# Patient Record
Sex: Male | Born: 1935
Health system: Southern US, Community
[De-identification: ages and names within clinical notes are randomized; demographics above are authoritative.]

## PROBLEM LIST (undated history)

## (undated) DIAGNOSIS — Z972 Presence of dental prosthetic device (complete) (partial): Secondary | ICD-10-CM

## (undated) DIAGNOSIS — I739 Peripheral vascular disease, unspecified: Secondary | ICD-10-CM

## (undated) DIAGNOSIS — Z973 Presence of spectacles and contact lenses: Secondary | ICD-10-CM

## (undated) DIAGNOSIS — J189 Pneumonia, unspecified organism: Secondary | ICD-10-CM

## (undated) DIAGNOSIS — I2510 Atherosclerotic heart disease of native coronary artery without angina pectoris: Secondary | ICD-10-CM

## (undated) DIAGNOSIS — I6529 Occlusion and stenosis of unspecified carotid artery: Secondary | ICD-10-CM

## (undated) DIAGNOSIS — B029 Zoster without complications: Secondary | ICD-10-CM

## (undated) DIAGNOSIS — E785 Hyperlipidemia, unspecified: Secondary | ICD-10-CM

## (undated) DIAGNOSIS — I10 Essential (primary) hypertension: Secondary | ICD-10-CM

## (undated) HISTORY — PX: MULTIPLE TOOTH EXTRACTIONS: SHX2053

## (undated) HISTORY — DX: Essential (primary) hypertension: I10

## (undated) HISTORY — PX: COLONOSCOPY: SHX174

## (undated) HISTORY — DX: Hyperlipidemia, unspecified: E78.5

## (undated) HISTORY — PX: CORONARY ANGIOPLASTY WITH STENT PLACEMENT: SHX49

## (undated) HISTORY — DX: Atherosclerotic heart disease of native coronary artery without angina pectoris: I25.10

## (undated) HISTORY — PX: CARDIAC CATHETERIZATION: SHX172

## (undated) HISTORY — PX: CATARACT EXTRACTION W/ INTRAOCULAR LENS  IMPLANT, BILATERAL: SHX1307

## (undated) HISTORY — PX: TONSILLECTOMY: SUR1361

## (undated) HISTORY — DX: Occlusion and stenosis of unspecified carotid artery: I65.29

## (undated) HISTORY — DX: Peripheral vascular disease, unspecified: I73.9

## (undated) HISTORY — PX: EYE SURGERY: SHX253

---
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
## 1998-09-24 ENCOUNTER — Encounter: Payer: Self-pay | Admitting: Emergency Medicine

## 1998-09-24 ENCOUNTER — Inpatient Hospital Stay (HOSPITAL_COMMUNITY): Admission: EM | Admit: 1998-09-24 | Discharge: 1998-09-26 | Payer: Self-pay | Admitting: Emergency Medicine

## 1998-09-24 HISTORY — PX: CORONARY ANGIOPLASTY WITH STENT PLACEMENT: SHX49

## 2004-05-10 ENCOUNTER — Emergency Department (HOSPITAL_COMMUNITY): Admission: EM | Admit: 2004-05-10 | Discharge: 2004-05-10 | Payer: Self-pay | Admitting: Emergency Medicine

## 2005-05-29 ENCOUNTER — Inpatient Hospital Stay (HOSPITAL_BASED_OUTPATIENT_CLINIC_OR_DEPARTMENT_OTHER): Admission: RE | Admit: 2005-05-29 | Discharge: 2005-05-29 | Payer: Self-pay | Admitting: Cardiovascular Disease

## 2005-05-31 ENCOUNTER — Inpatient Hospital Stay (HOSPITAL_COMMUNITY): Admission: RE | Admit: 2005-05-31 | Discharge: 2005-06-01 | Payer: Self-pay | Admitting: Cardiovascular Disease

## 2008-11-20 DIAGNOSIS — E785 Hyperlipidemia, unspecified: Secondary | ICD-10-CM | POA: Insufficient documentation

## 2008-11-20 DIAGNOSIS — I10 Essential (primary) hypertension: Secondary | ICD-10-CM | POA: Insufficient documentation

## 2008-11-20 DIAGNOSIS — M1990 Unspecified osteoarthritis, unspecified site: Secondary | ICD-10-CM | POA: Insufficient documentation

## 2008-11-20 DIAGNOSIS — I2510 Atherosclerotic heart disease of native coronary artery without angina pectoris: Secondary | ICD-10-CM | POA: Insufficient documentation

## 2008-11-23 ENCOUNTER — Ambulatory Visit: Payer: Self-pay | Admitting: Internal Medicine

## 2008-12-31 ENCOUNTER — Ambulatory Visit: Payer: Self-pay | Admitting: Internal Medicine

## 2009-08-20 ENCOUNTER — Inpatient Hospital Stay (HOSPITAL_COMMUNITY): Admission: EM | Admit: 2009-08-20 | Discharge: 2009-08-25 | Payer: Self-pay | Admitting: Emergency Medicine

## 2010-03-22 ENCOUNTER — Ambulatory Visit: Payer: Self-pay | Admitting: Cardiovascular Disease

## 2010-06-02 IMAGING — CR DG CHEST 1V PORT
1 series · 1 of 1 positions shown · non-contrast
Comparison: None

CLINICAL DATA: Chest pain

PORTABLE CHEST - 1 VIEW

[AP]
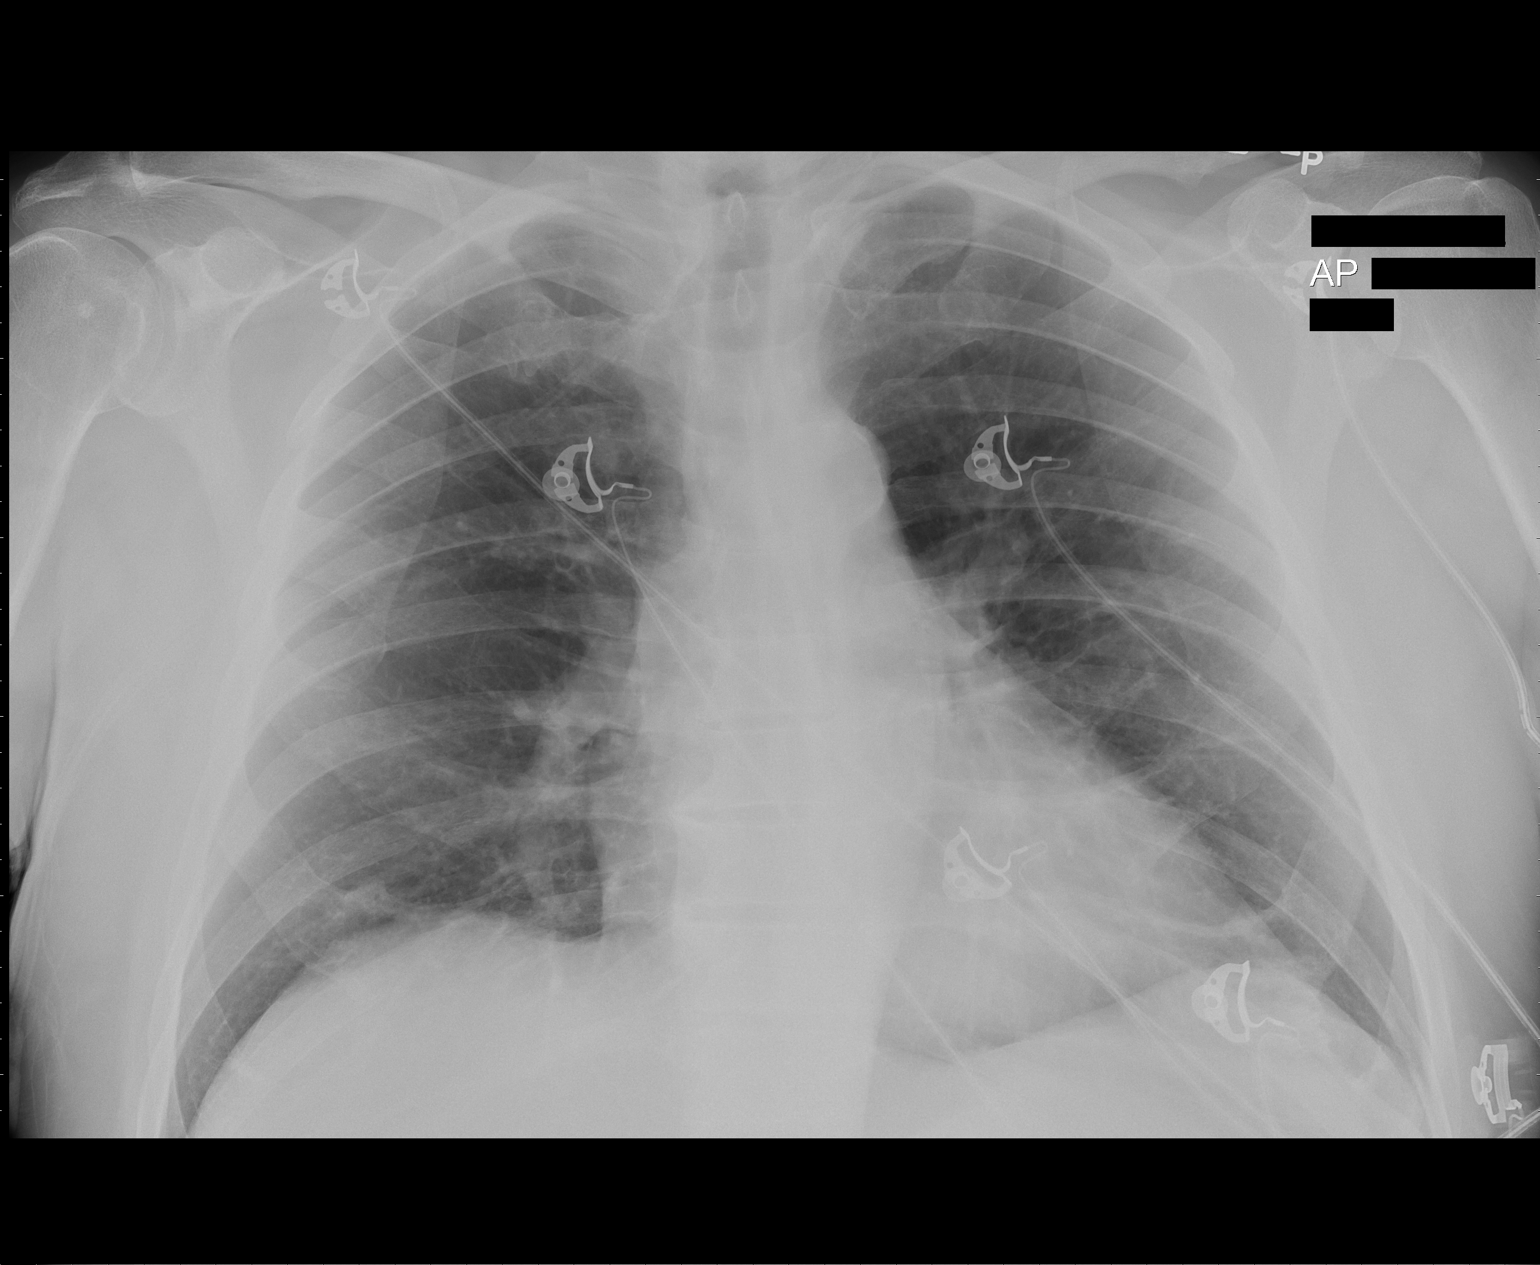

[1 of 1 positions shown; findings below may reference images not displayed]

FINDINGS: Normal heart size and vascularity.  Negative for
pneumonia, edema, effusion or pneumothorax.  Midline trachea.
Atherosclerosis of the aorta.
IMPRESSION: No acute chest process.

## 2010-09-27 ENCOUNTER — Ambulatory Visit: Payer: Self-pay | Admitting: Cardiovascular Disease

## 2010-09-29 ENCOUNTER — Ambulatory Visit (INDEPENDENT_AMBULATORY_CARE_PROVIDER_SITE_OTHER): Payer: Medicare Other | Admitting: Cardiovascular Disease

## 2010-09-29 DIAGNOSIS — Z9861 Coronary angioplasty status: Secondary | ICD-10-CM

## 2010-09-29 DIAGNOSIS — I2510 Atherosclerotic heart disease of native coronary artery without angina pectoris: Secondary | ICD-10-CM

## 2010-10-16 LAB — BASIC METABOLIC PANEL
BUN: 10 mg/dL (ref 6–23)
BUN: 10 mg/dL (ref 6–23)
CO2: 25 mEq/L (ref 19–32)
CO2: 29 mEq/L (ref 19–32)
CO2: 29 mEq/L (ref 19–32)
Calcium: 8.5 mg/dL (ref 8.4–10.5)
Chloride: 104 mEq/L (ref 96–112)
Chloride: 104 mEq/L (ref 96–112)
Creatinine, Ser: 0.79 mg/dL (ref 0.4–1.5)
Creatinine, Ser: 0.87 mg/dL (ref 0.4–1.5)
Creatinine, Ser: 0.9 mg/dL (ref 0.4–1.5)
GFR calc Af Amer: >60 mL/min (ref >60)
GFR calc Af Amer: >60 mL/min (ref >60)
Potassium: 3.5 mEq/L (ref 3.5–5.1)
Potassium: 3.8 mEq/L (ref 3.5–5.1)
Sodium: 141 mEq/L (ref 135–145)

## 2010-10-16 LAB — POCT I-STAT, CHEM 8
BUN: 13 mg/dL (ref 6–23)
Calcium, Ion: 1.16 mmol/L (ref 1.12–1.32)
Chloride: 105 mEq/L (ref 96–112)
Creatinine, Ser: 0.8 mg/dL (ref 0.4–1.5)
Glucose, Bld: 139 mg/dL — ABNORMAL HIGH (ref 70–99)
HCT: 41 % (ref 39.0–52.0)
Hemoglobin: 13.9 g/dL (ref 13.0–17.0)
Potassium: 3.7 mEq/L (ref 3.5–5.1)
Sodium: 139 mEq/L (ref 135–145)
TCO2: 26 mmol/L (ref 0–100)

## 2010-10-16 LAB — CBC
HCT: 38.3 % — ABNORMAL LOW (ref 39.0–52.0)
HCT: 39.4 % (ref 39.0–52.0)
HCT: 39.5 % (ref 39.0–52.0)
HCT: 39.8 % (ref 39.0–52.0)
HCT: 40.2 % (ref 39.0–52.0)
Hemoglobin: 13.7 g/dL (ref 13.0–17.0)
Hemoglobin: 14.2 g/dL (ref 13.0–17.0)
MCHC: 34.7 g/dL (ref 30.0–36.0)
MCHC: 35 g/dL (ref 30.0–36.0)
MCHC: 35.3 g/dL (ref 30.0–36.0)
MCHC: 35.5 g/dL (ref 30.0–36.0)
MCV: 103.9 fL — ABNORMAL HIGH (ref 78.0–100.0)
MCV: 105.3 fL — ABNORMAL HIGH (ref 78.0–100.0)
MCV: 105.3 fL — ABNORMAL HIGH (ref 78.0–100.0)
MCV: 105.6 fL — ABNORMAL HIGH (ref 78.0–100.0)
MCV: 105.9 fL — ABNORMAL HIGH (ref 78.0–100.0)
Platelets: 282 10*3/uL (ref 150–400)
Platelets: 285 10*3/uL (ref 150–400)
Platelets: 287 10*3/uL (ref 150–400)
Platelets: 291 10*3/uL (ref 150–400)
Platelets: 300 10*3/uL (ref 150–400)
RBC: 3.62 MIL/uL — ABNORMAL LOW (ref 4.22–5.81)
RBC: 3.76 MIL/uL — ABNORMAL LOW (ref 4.22–5.81)
RBC: 3.87 MIL/uL — ABNORMAL LOW (ref 4.22–5.81)
RDW: 15 % (ref 11.5–15.5)
RDW: 15 % (ref 11.5–15.5)
RDW: 15.1 % (ref 11.5–15.5)
WBC: 8.2 10*3/uL (ref 4.0–10.5)
WBC: 8.9 10*3/uL (ref 4.0–10.5)
WBC: 9 10*3/uL (ref 4.0–10.5)

## 2010-10-16 LAB — CARDIAC PANEL(CRET KIN+CKTOT+MB+TROPI)
Relative Index: 3.1 — ABNORMAL HIGH (ref 0.0–2.5)
Relative Index: 4.1 — ABNORMAL HIGH (ref 0.0–2.5)
Total CK: 100 U/L (ref 7–232)
Troponin I: 0.26 ng/mL — ABNORMAL HIGH (ref 0.00–0.06)
Troponin I: 0.32 ng/mL — ABNORMAL HIGH (ref 0.00–0.06)

## 2010-10-16 LAB — DIFFERENTIAL
Basophils Absolute: 0 10*3/uL (ref 0.0–0.1)
Basophils Relative: 0 % (ref 0–1)
Eosinophils Absolute: 0.4 10*3/uL (ref 0.0–0.7)
Eosinophils Relative: 4 % (ref 0–5)
Lymphocytes Relative: 16 % (ref 12–46)
Lymphs Abs: 1.3 10*3/uL (ref 0.7–4.0)
Monocytes Absolute: 0.9 10*3/uL (ref 0.1–1.0)
Monocytes Relative: 11 % (ref 3–12)
Neutro Abs: 5.6 10*3/uL (ref 1.7–7.7)
Neutrophils Relative %: 69 % (ref 43–77)

## 2010-10-16 LAB — POCT CARDIAC MARKERS

## 2010-12-16 NOTE — Cardiovascular Report (Signed)
NAME:  Brandon Roy, Brandon Roy                  ACCOUNT NO.:  [id number]   MEDICAL RECORD NO.:  [id number]          PATIENT TYPE:  OIB   LOCATION:  1962                         FACILITY:  [facility]   PHYSICIAN:  Vesta Mixer, M.D. DATE OF BIRTH:  [birth year]   DATE OF PROCEDURE:  05/29/2005  DATE OF DISCHARGE:                              CARDIAC CATHETERIZATION   [name] is a 77 year old gentleman with a history of coronary artery  disease. He is status post PTCA and stenting of his left anterior descending  artery approximately 6 years ago. He presented to the office with some  recent episodes of chest pain. The chest pain would occurred intermittently  with exertion. He is scheduled for an outpatient heart catheterization.   PROCEDURE:  Left heart catheterization with coronary angiography.   The right femoral artery was easily cannulated using a modified Seldinger  technique.   HEMODYNAMICS:  The LV pressure was 118/20.  The Aortic pressure is 116/62.   ANGIOGRAPHY:  1.  Left main:  The left main is fairly smooth and normal.  2.  The left anterior descending artery has a stent in the proximal segment.      There is an approximately 50% stenosis in the middle part of the stent.      This InStent restenosis is most severe at the takeoff of the first      diagonal vessel. The remainder of the left anterior descending artery      has moderate luminal irregularities.  3.  The left circumflex artery is a large vessel. There is a 90% stenosis in      the proximal aspect of this vessel. This occurs just prior to the      takeoff of the first obtuse marginal vessel.  4.  The first obtuse marginal vessel is an extremely large vessel. There is      a 20% stenosis in the proximal aspect of this. There is a fairly normal      segment of artery between the 90% stenosis and the 20% stenosis of the      first OM. The terminal left circumflex artery is a relatively small      branch.  5.  The right  coronary artery is a moderate size vessel and is dominant.      There is a 30-40% stenosis in the proximal aspect of the vessel. The mid      right coronary artery has minor luminal irregularities. The distal right      coronary artery has another 20-30% stenosis.  6.  The posterior descending artery and the posterolateral segment arteries      are fairly small, but are otherwise unremarkable.   COMPLICATIONS:  None.   CONCLUSIONS:  Coronary artery disease, primarily involving the left  circumflex artery. He has a 90% stenosis in the left circumflex artery. He  has a 50% InStent restenosis of the left anterior descending artery. He has  mild  irregularities of the right coronary artery. We will review the angiogram.  Our options would include PTCA and stenting of the  left circumflex artery.  At this point, it appears that the left anterior descending artery is not  all that severe, but we will review the films further. I do not think that  the patient will need bypass grafting at this time.           ______________________________  Vesta Mixer, M.D.     PJN/MEDQ  D:  05/29/2005  T:  05/29/2005  Job:  [id number]   cc:   Larina Earthly, M.D.  Fax: [phone]

## 2010-12-16 NOTE — Discharge Summary (Signed)
NAME:  ABBIE, JABLON                  ACCOUNT NO.:  [id number]   MEDICAL RECORD NO.:  [id number]          PATIENT TYPE:  INP   LOCATION:  6522                         FACILITY:  [facility]   PHYSICIAN:  Vesta Mixer, M.D. DATE OF BIRTH:  [birth year]   DATE OF ADMISSION:  05/31/2005  DATE OF DISCHARGE:  06/01/2005                                 DISCHARGE SUMMARY   DISCHARGE DIAGNOSES:  1.  Coronary artery disease - status post percutaneous transluminal coronary      angioplasty and stenting of his left circumflex artery.  2.  History of a percutaneous transluminal coronary angioplasty and stenting      of his left anterior descending artery.  3.  Hyperlipidemia  4.  Hypertension.   DISCHARGE MEDICATIONS:  1.  Plavix 75 milligrams a day for least the next 1 or 2 years.  2.  Enteric-coated aspirin 81 milligrams a day.  3.  Nitroglycerin 0.4 milligrams sublingually as needed.  4.  Crestor 10 milligrams every other day.  5.  Diovan HCT 160 milligrams/12.5 milligrams a day.  6.  Metoprolol 50 milligrams tablets - 1/2 tablet a day.   DISPOSITION:  The patient will see Dr. Elease Hashimoto in 1-2 weeks.   HISTORY:  Mr. Devonshire is a 77 year old gentleman with a history of coronary  artery disease who recently presented with episodes of unstable angina. He  had a diagnostic heart catheterization which revealed a tight stenosis in  his left circumflex artery. He was brought back for stenting of the  circumflex artery. Please see dictated H&P for further details.   HOSPITAL COURSE BY PROBLEMS:  1.  Coronary artery disease. The patient had successful PTCA and stenting of      his left circumflex artery. We placed a 3.0 x 12 mm Taxus stent deployed      at 16 atmospheres. It was postdilated using a 3.25 mm x 12 mm Quantum      monorail up to 16 atmospheres. He had a very nice angiographic result      with no evidence of edge dissection.  2.  The left anterior descending artery has a 40-50% in-stent  restenosis      which we have left alone and will treat medically. That did not want to      potentially jeopardize the left anterior descending artery distribution      as well as putting the circumflex distribution at risk. He is not having      any real symptoms at this point and we will continue to watch him      clinically. He will be discharged on the above-noted medications and      disposition.           ______________________________  Vesta Mixer, M.D.     PJN/MEDQ  D:  06/01/2005  T:  06/01/2005  Job:  [id number]

## 2010-12-16 NOTE — Cardiovascular Report (Signed)
NAME:  [name], [name]                  ACCOUNT NO.:  [id number]   MEDICAL RECORD NO.:  [id number]          PATIENT TYPE:  OIB   LOCATION:  6522                         FACILITY:  [facility]   PHYSICIAN:  Vesta Mixer, M.D. DATE OF BIRTH:  1934-04-13   DATE OF PROCEDURE:  05/31/2005  DATE OF DISCHARGE:                              CARDIAC CATHETERIZATION   INDICATIONS FOR PROCEDURE:  Brandon Roy is a middle-aged gentleman with a  history of coronary artery disease.  He is status post PTCA and stenting of  his left anterior descending artery in 2000. He recently presented with  episodes of angina. He had an outpatient diagnostic heart catheterization,  which revealed a tight 99% stenosis in his left circumflex artery.  He was  also found to have a 40-50% in-stent restenosis involving his left anterior  descending artery stent. After review of the films, we decided to proceed  with PTCA and stenting of the left circumflex artery.  He is brought back  today for that procedure.   PROCEDURE:  Left heart catheterization with PTCA and stenting of the left  circumflex artery.   PREPROCEDURE MEDICATIONS:  The patient received 4500 units of heparin.  He  received a double bolus Integrilin drip.   DESCRIPTION OF PROCEDURE:  The left main was engaged using a Judkins left  3.5 guide. The left circumflex artery was wired using a BMW guide wire. A  3.0 x 12 mm Quantum Maverick was positioned across the stenosis. It was  inflated to up to 4 atm for 8 seconds. followed by 10 atm for 22 seconds.  This resulted in significant improvement of the lumen.   At this point, a 3.0 x 12 mm Taxus stent was positioned across the lesion.  It was then deployed at 16 atm for 30 seconds. Following this, post stent  dilatation was achieved using a 3.25 x 12 mm Quantum Monorail. It was  inflated up to 16 atm for 27 seconds. This resulted in a very nice  angiographic result. There was no residual stenosis. We took great  care to  position the distal end of the stent just prior to the bifurcation of the  first OM and the distal circumflex artery.   The patient tolerated the procedure quite well. We will anticipate discharge  tomorrow.           ______________________________  Vesta Mixer, M.D.     PJN/MEDQ  D:  05/31/2005  T:  05/31/2005  Job:  [id number]   cc:   Larina Earthly, M.D.  Fax: [phone]

## 2010-12-16 NOTE — H&P (Signed)
NAME:  Brandon Roy, Brandon Roy                  ACCOUNT NO.:  [id number]   MEDICAL RECORD NO.:  [id number]           PATIENT TYPE:   LOCATION:                                 FACILITY:   PHYSICIAN:  Vesta Mixer, M.D.      DATE OF BIRTH:   DATE OF ADMISSION:  05/29/2005  DATE OF DISCHARGE:                                HISTORY & PHYSICAL   CHIEF COMPLAINT:  Zakye is a 77 year old gentleman with a history of  coronary artery disease. He is status post PTCA and stenting of his left  anterior descending artery approximately 6 years ago. He was noted to have  moderate disease of the right coronary artery at that time. He has done  fairly well and has not had any episodes of chest pain until recently.   In the past several weeks he has started having intermittent episodes of  chest pressure. This chest pressure typically occurs when he is out walking.  He states that he can use his hands and arms and do vigorous exercise  without any significant problems but as soon as he starts to walk he gets  this chest tightness. The tightness radiates across his chest and down his  arms. It typically resolved with rest and also resolves with nitroglycerin.  He has not really done any yard work since that time because the chest pains  have become more predictive.   CURRENT MEDICATIONS:  1.  Enteric coated aspirin 325 mg daily.  2.  Lopressor 25 mg p.o. b.i.d.  3.  Vitamin C once daily.  4.  Diovan HCT 160 mg/12.5 mg daily.  5.  Crestor 10 mg every other day.   ALLERGIES:  HE HAS BEEN INTOLERANT TO ZOCOR AND LIPITOR IN THE PAST DUE TO  MUSCLE ACHES.   PAST MEDICAL HISTORY:  1.  Dyslipidemia.  2.  Coronary artery disease.  3.  Hypokalemia.   SOCIAL HISTORY:  The patient quit smoking 40 years ago.   FAMILY HISTORY:  Unremarkable.   REVIEW OF SYSTEMS:  Unremarkable.   PHYSICAL EXAMINATION:  GENERAL:  He is a middle-aged gentleman in no acute  distress. He is alert and oriented x3 and his mood and  affect are normal.  VITAL SIGNS:  Weight is 191. Blood pressure is 130/88 with a heart rate of  84.  HEENT EXAM:  Reveals 2+ carotids, he has no bruits, no JVD, no thyromegaly.  LUNGS:  Clear to auscultation.  HEART:  Regular rate S1, S2. He has no murmurs.  ABDOMEN:  Exam reveals good bowel sounds and is nontender.  EXTREMITIES:  No clubbing, cyanosis or edema.  NEURO EXAM:  Nonfocal.   Jestin presents with episodes of fairly classic angina. He has chest pain  with very little amount of physical exertion and the pains are relieved with  rest and/or nitroglycerin. At this point I would favor an outpatient heart  catheterization. We have discussed the risks, benefits and options of heart  catheterization. He understands and agrees to proceed. His lab work is  currently pending. I have started him  on Imdur 30 mg daily and have asked  him to call me if he has any worsening chest pains.           ______________________________  Vesta Mixer, M.D.     PJN/MEDQ  D:  05/24/2005  T:  05/24/2005  Job:  [id number]   cc:   Larina Earthly, M.D.  Fax: [phone]

## 2011-01-04 ENCOUNTER — Other Ambulatory Visit: Payer: Self-pay | Admitting: Cardiovascular Disease

## 2011-01-05 ENCOUNTER — Other Ambulatory Visit: Payer: Self-pay | Admitting: *Deleted

## 2011-01-05 MED ORDER — CLOPIDOGREL BISULFATE 75 MG PO TABS: 75.0000 mg | ORAL_TABLET | Freq: Every day | ORAL | Status: DC

## 2011-01-05 NOTE — Telephone Encounter (Signed)
Fax received from pharmacy. Refill completed. Jodette Aletheia Tangredi RN  

## 2011-03-06 ENCOUNTER — Telehealth: Payer: Self-pay | Admitting: Cardiovascular Disease

## 2011-03-06 NOTE — Telephone Encounter (Signed)
Pt states please call back regarding plavix, pt has questions

## 2011-03-06 NOTE — Telephone Encounter (Signed)
Called pt no answer, msg left that i will call him again.

## 2011-03-06 NOTE — Telephone Encounter (Signed)
Dr Elease Hashimoto, he is to have cataract surg 03/23/11 pt on plavix and asa, last stent 2011. Please advise how long to be off plavix and asa?

## 2011-03-06 NOTE — Telephone Encounter (Signed)
Called again and left msg, pt to call us back.

## 2011-03-07 ENCOUNTER — Telehealth: Payer: Self-pay | Admitting: Cardiovascular Disease

## 2011-03-07 NOTE — Telephone Encounter (Signed)
He had several stents placed in January , 2011.  He may hold Plavix for 5 days.  I would prefer that he not stop the Aspirin.

## 2011-03-07 NOTE — Telephone Encounter (Signed)
Advised patient and will fax signed ok next week when Dr Elease Hashimoto back

## 2011-03-07 NOTE — Telephone Encounter (Signed)
Called needing clearance to stop his plavix and for overall surgical clearance for 03/21/11. Fax: [phone] I have pulled his chart.

## 2011-03-07 NOTE — Telephone Encounter (Signed)
Advised patient, verbalized understanding  

## 2011-03-13 ENCOUNTER — Telehealth: Payer: Self-pay | Admitting: *Deleted

## 2011-03-13 NOTE — Telephone Encounter (Signed)
msg left to hold plavix 5 days prior to procedure but to continue asa.

## 2011-03-15 ENCOUNTER — Telehealth: Payer: Self-pay | Admitting: *Deleted

## 2011-03-15 NOTE — Telephone Encounter (Signed)
CONFIRMED WITH PT HE WAS TO STOP PLAVIX 5 DAYS PRIOR BUT CONTINUE ASA./ CLEARANCE SENT TO EYE SURGEON,Pt verbalized understanding. Alfonso Ramus RN

## 2011-03-16 ENCOUNTER — Encounter: Payer: Self-pay | Admitting: Cardiovascular Disease

## 2011-04-17 ENCOUNTER — Other Ambulatory Visit: Payer: Self-pay | Admitting: *Deleted

## 2011-04-17 MED ORDER — FENOFIBRATE 160 MG PO TABS: 160.0000 mg | ORAL_TABLET | Freq: Every day | ORAL | Status: DC

## 2011-04-17 NOTE — Telephone Encounter (Signed)
Fax received from pharmacy. Refill completed. Jodette Zakiah Beckerman RN  

## 2011-04-24 ENCOUNTER — Other Ambulatory Visit: Payer: Self-pay | Admitting: *Deleted

## 2011-04-24 ENCOUNTER — Telehealth: Payer: Self-pay | Admitting: Cardiovascular Disease

## 2011-04-24 MED ORDER — METOPROLOL TARTRATE 25 MG PO TABS: 25.0000 mg | ORAL_TABLET | Freq: Two times a day (BID) | ORAL | Status: DC

## 2011-04-24 NOTE — Telephone Encounter (Signed)
Pt needs refill on metoprolol [facility] [address]

## 2011-04-24 NOTE — Telephone Encounter (Signed)
Patient request refill. Completed/ pt has app soon, Alfonso Ramus RN

## 2011-04-24 NOTE — Telephone Encounter (Signed)
Opened in Error.

## 2011-05-12 ENCOUNTER — Other Ambulatory Visit: Payer: Self-pay | Admitting: Cardiovascular Disease

## 2011-05-14 ENCOUNTER — Inpatient Hospital Stay (HOSPITAL_COMMUNITY)
Admission: EM | Admit: 2011-05-14 | Discharge: 2011-05-15 | DRG: 312 | Disposition: A | Discharge status: Home / Self Care | Payer: Medicare Other | Source: Ambulatory Visit | Attending: Cardiovascular Disease | Admitting: Cardiovascular Disease

## 2011-05-14 ENCOUNTER — Emergency Department (HOSPITAL_COMMUNITY): Payer: Medicare Other

## 2011-05-14 DIAGNOSIS — Z9861 Coronary angioplasty status: Secondary | ICD-10-CM

## 2011-05-14 DIAGNOSIS — I2510 Atherosclerotic heart disease of native coronary artery without angina pectoris: Secondary | ICD-10-CM | POA: Diagnosis present

## 2011-05-14 DIAGNOSIS — Z8249 Family history of ischemic heart disease and other diseases of the circulatory system: Secondary | ICD-10-CM

## 2011-05-14 DIAGNOSIS — I10 Essential (primary) hypertension: Secondary | ICD-10-CM | POA: Diagnosis present

## 2011-05-14 DIAGNOSIS — R55 Syncope and collapse: Principal | ICD-10-CM | POA: Diagnosis present

## 2011-05-14 DIAGNOSIS — Z7982 Long term (current) use of aspirin: Secondary | ICD-10-CM

## 2011-05-14 DIAGNOSIS — Z79899 Other long term (current) drug therapy: Secondary | ICD-10-CM

## 2011-05-14 DIAGNOSIS — E785 Hyperlipidemia, unspecified: Secondary | ICD-10-CM | POA: Diagnosis present

## 2011-05-14 DIAGNOSIS — Z7902 Long term (current) use of antithrombotics/antiplatelets: Secondary | ICD-10-CM

## 2011-05-14 LAB — POCT I-STAT, CHEM 8
Creatinine, Ser: 1 mg/dL (ref 0.50–1.35)
HCT: 43 % (ref 39.0–52.0)
Hemoglobin: 14.6 g/dL (ref 13.0–17.0)
Potassium: 3.4 mEq/L — ABNORMAL LOW (ref 3.5–5.1)
Sodium: 139 mEq/L (ref 135–145)
TCO2: 23 mmol/L (ref 0–100)

## 2011-05-14 LAB — DIFFERENTIAL
Basophils Absolute: 0 10*3/uL (ref 0.0–0.1)
Basophils Relative: 0 % (ref 0–1)
Eosinophils Absolute: 0.3 10*3/uL (ref 0.0–0.7)
Eosinophils Relative: 3 % (ref 0–5)
Lymphs Abs: 1.3 10*3/uL (ref 0.7–4.0)
Neutrophils Relative %: 77 % (ref 43–77)

## 2011-05-14 LAB — POCT I-STAT TROPONIN I: Troponin i, poc: 0 ng/mL (ref 0.00–0.08)

## 2011-05-14 LAB — CBC
MCV: 88.5 fL (ref 78.0–100.0)
Platelets: 279 10*3/uL (ref 150–400)
RBC: 4.52 MIL/uL (ref 4.22–5.81)
RDW: 13.4 % (ref 11.5–15.5)
WBC: 13.6 10*3/uL — ABNORMAL HIGH (ref 4.0–10.5)

## 2011-05-15 DIAGNOSIS — R55 Syncope and collapse: Secondary | ICD-10-CM

## 2011-05-15 LAB — LIPID PANEL
LDL Cholesterol: 78 mg/dL (ref 0–99)
Total CHOL/HDL Ratio: 3 RATIO
VLDL: 17 mg/dL (ref 0–40)

## 2011-05-15 LAB — CARDIAC PANEL(CRET KIN+CKTOT+MB+TROPI)
CK, MB: 3.9 ng/mL (ref 0.3–4.0)
Total CK: 216 U/L (ref 7–232)
Troponin I: <0.3 ng/mL (ref <0.30)

## 2011-05-25 NOTE — H&P (Signed)
NAME:  Brandon Roy, RIVIELLO NO.:  [id number]  MEDICAL RECORD NO.:  [id number]  LOCATION:  [facility]                         FACILITY:  [facility]  PHYSICIAN:  Therisa Doyne, MD    DATE OF BIRTH:  05/09/1934  DATE OF ADMISSION:  05/14/2011 DATE OF DISCHARGE:                             HISTORY & PHYSICAL   PRIMARY CARE PHYSICIAN:  Gwen Pounds, MD, of [facility].  PRIMARY CARDIOLOGIST:  Vesta Mixer, MD  CHIEF COMPLAINT:  Syncope.  HISTORY OF PRESENT ILLNESS:  A 77 year old white male with a past medical history significant for coronary artery disease status post PCI who presents with syncope tonight.  The patient does note that over the past 1-2 weeks, several family members have had possible viral infection with loose stool.  He has been having some loose stools for several weeks now tonight.  His stomach was somewhat upset and he felt nauseated.  Shortly thereafter, he had a witnessed syncopal event where he fell forward from a seating position seated position onto the table. He was out for about 20 seconds.  When he awoke he was pale and clammy and very diaphoretic.  There was no preceding chest pain and no preceding palpitation.  Currently, he feels fine.  He denies any recent anginal symptoms or heart failure symptoms.  He has no previous history of syncope.  He has been very active and enjoys doing yard work, such as trimming the bushes and he can do this without any cardiac limitations.  PAST MEDICAL HISTORY: 1. Coronary artery disease status post multiple PCIs in 2011.  He had     a 3 x 15 PROMUS stent to the distal right coronary artery, a 2.25 x     16 Taxus stent to the distal LAD and a 3.5 x 23 mm PROMUS stent to     the proximal LAD for in-stent restenoses.  In 2006, he had a 3 x 12     Taxus stent to the circumflex artery. 2. Hypertension. 3. Hyperlipidemia.  SOCIAL HISTORY:  The patient lives at home with his wife.  He denies  any tobacco, alcohol, or drug use.  FAMILY HISTORY:  Negative for recent history of syncope or sudden cardiac death.  ALLERGIES:  No known drug allergies.  He does have some mild intolerance to Lipitor and Zocor.  MEDICATIONS: 1. Diovan/hydrochlorothiazide 320/12.5 mg daily. 2. Lopressor 25 mg b.i.d. 3. Plavix 75 mg daily. 4. Aspirin 325 mg daily. 5. TriCor 160 mg daily. 6. Crestor 10 mg daily.  Review of Systems:  All systems were reviewed and were negative except as mentioned above in the history of present illness.  PHYSICAL EXAMINATION:  VITAL SIGNS:  Temperature afebrile, blood pressure is 117/57, pulse is 83, respirations 18, and oxygen saturation 98% on room air. GENERAL:  No acute distress. HEENT:  Normocephalic, atraumatic.  Pupils are equal, round, reactive to light and accommodation.  Oropharynx is pink and moist without any lesions. NECK:  Supple without lymphadenopathy.  No jugular venous distention. No masses. CARDIOVASCULAR:  Regular rate and rhythm.  No murmurs, rubs, gallops. CHEST:  Clear to auscultation bilaterally. ABDOMEN:  Positive  bowel sounds.  Soft, nontender, and nondistended. EXTREMITIES:  No clubbing, cyanosis, or edema.  Dorsalis pedis pulses 2+ bilaterally. SKIN:  No rashes. BACK:  No CVA tenderness. NEUROLOGIC:  No focal deficits. PSYCH:  Normal affect.  PERTINENT DATA:  Labs notable for a white count of 13.6, hemoglobin 14.2, platelets 279, BUN 18, creatinine 1, potassium 3.4, troponin 0.00.  Chest x-ray shows no acute cardiopulmonary disease.  CT of the head shows no acute intracranial abnormality.  EKG from 2147 demonstrates sinus rhythm at 83 beats per minute, first- degree AV block, normal axis, normal intervals and a QTc of 432.  IMPRESSION AND PLAN:  [name] is a 77 year old gentleman with past medical history significant for coronary artery disease, status post PCI, hypertension, hyperlipidemia who presents with a syncopal  episode this evening. 1. We will admit the patient to [facility] under Dr. Harvie Bridge     care. 2. Syncope by history.  This is most consistent with vasovagal     syncope.  This is the most likely etiology.  However, I cannot rule     out an arrhythmogenic cause for his syncope.  We will monitor him     on telemetry and cycle cardiac enzymes to ensure that this was not     an ischemic event.  We will check an echocardiogram in the morning     to document his left ventricular function to rule out any     structural heart disease.  If his workup is normal, then I think     that he could likely be discharged in the morning with outpatient     followup. 3. Coronary artery disease.  He appears to be stable with no active     signs of ischemia.  We will cycle cardiac enzymes.  Continue him on     his home coronary regimen of aspirin, Plavix, and statin. 4. Hyperlipidemia.  Check fasting lipid profile.  Continue Crestor and     TriCor. 5. Fluids, electrolytes, nutrition.  Normal saline at 75 mL per hour.     He is hypokalemic and we will replace his potassium. 6. DVT prophylaxis with subcutaneous heparin.     Therisa Doyne, MD     SJT/MEDQ  D:  05/14/2011  T:  05/14/2011  Job:  [id number]  Electronically Signed by Aldona Bar MD on 05/25/2011 06:11:31 AM

## 2011-06-02 NOTE — Discharge Summary (Signed)
  NAME:  Brandon Roy, Brandon Roy                  ACCOUNT NO.:  [id number]  MEDICAL RECORD NO.:  [id number]  LOCATION:  2040                         FACILITY:  [facility]  PHYSICIAN:  Jesse Sans. Najia Hurlbutt, MD, FACCDATE OF BIRTH:  1934/03/05  DATE OF ADMISSION:  05/14/2011 DATE OF DISCHARGE:  05/15/2011                              DISCHARGE SUMMARY   PROCEDURES: 1. CT of the head without contrast. 2. Portable chest x-ray. 3. A 2-D echocardiogram.  FINAL DISCHARGE DIAGNOSIS:  Syncope.  SECONDARY DIAGNOSES: 1. Status post percutaneous intervention to the proximal left anterior     descending and distal left anterior descending as well as right     coronary artery in January, 2011. 2. Status post stent to the left anterior descending in 2000. 3. Status post stent to the circumflex in 2006. 4. History of intolerance to Zocor and Lipitor. 5. Dyslipidemia. 6. History of hypokalemia. 7. Family history of coronary artery disease in his father. 8. Preserved left ventricular function with an echocardiogram this     admission showing an ejection fraction of 55-60% and grade 1     diastolic dysfunction.  TIME AT DISCHARGE:  34 minutes.  HOSPITAL COURSE:  Brandon Roy is a 77 year old male with a history of coronary artery disease.  He had an episode of syncope as well as some recent diarrhea and nausea.  He was admitted for further evaluation and treatment.  His cardiac enzymes were negative for MI.  He had no chest pain.  His white count was minimally elevated at 13,600, but he was afebrile.  His potassium was slightly low at 3.4, but this was supplemented.  His total cholesterol was 142, triglycerides 83, HDL 47, LDL 78 and no medication adjustments were indicated.  His systolic blood pressure was generally in the 100-110s.  He remained in sinus rhythm on telemetry.  His cardiac enzymes were cycled and remained negative.  On May 15, 2011, he was seen by Dr. Daleen Squibb.  Dr. Daleen Squibb felt that since he was symptom  free and had no critical arrhythmia on telemetry, he could be considered stable for discharge, to follow up as an outpatient.  DISCHARGE INSTRUCTIONS:  His activity level is to be increased gradually.  He is encouraged to stick to a low-sodium, heart healthy diet.  He is to follow up with Dr. Elease Hashimoto on November 7 at 10:30.  He is to follow up with Dr. Timothy Lasso as needed.  DISCHARGE MEDICATIONS: 1. Diovan HCT 300/12.5 one-half tablet b.i.d. 2. Lopressor 50 mg one-half tab b.i.d. 3. Fenofibrate 160 mg daily. 4. Crestor 20 mg one-half tab daily. 5. Sublingual nitroglycerin p.r.n. 6. Aspirin 325 mg a day. 7. Plavix 75 mg a day. 8. Vitamin B12 OTC daily.     Theodore Demark, PA-C   ______________________________ Jesse Sans Daleen Squibb, MD, [facility]    RB/MEDQ  D:  05/15/2011  T:  05/16/2011  Job:  [id number]  cc:   Gwen Pounds, MD  Electronically Signed by Theodore Demark PA-C on 05/22/2011 08:02:30 PM Electronically Signed by Valera Castle MD [facility] on 06/02/2011 01:38:26 PM

## 2011-06-06 ENCOUNTER — Encounter: Payer: Self-pay | Admitting: *Deleted

## 2011-06-07 ENCOUNTER — Encounter: Payer: Self-pay | Admitting: Cardiovascular Disease

## 2011-06-07 ENCOUNTER — Ambulatory Visit (INDEPENDENT_AMBULATORY_CARE_PROVIDER_SITE_OTHER): Payer: Medicare Other | Admitting: Cardiovascular Disease

## 2011-06-07 VITALS — BP 150/82 | HR 68 | Ht 69.0 in | Wt 196.1 lb

## 2011-06-07 DIAGNOSIS — E785 Hyperlipidemia, unspecified: Secondary | ICD-10-CM

## 2011-06-07 DIAGNOSIS — I2510 Atherosclerotic heart disease of native coronary artery without angina pectoris: Secondary | ICD-10-CM

## 2011-06-07 MED ORDER — FENOFIBRATE 160 MG PO TABS: 160.0000 mg | ORAL_TABLET | Freq: Every day | ORAL | Status: DC

## 2011-06-07 MED ORDER — METOPROLOL TARTRATE 50 MG PO TABS: 25.0000 mg | ORAL_TABLET | Freq: Two times a day (BID) | ORAL | Status: DC

## 2011-06-07 MED ORDER — CLOPIDOGREL BISULFATE 75 MG PO TABS: 75.0000 mg | ORAL_TABLET | Freq: Every day | ORAL | Status: DC

## 2011-06-07 NOTE — Assessment & Plan Note (Signed)
We'll continue with same medications. We'll check his lipids again in 6 months.

## 2011-06-07 NOTE — Progress Notes (Signed)
  CHEZ BULNES Date of Birth  12/28/1933 [facility] [address]    [address] [place], [place]  [address] [phone]  Fax  [phone]  History of Present Illness:  Mujtaba is a 77 year old gentleman with a history of coronary artery disease. Status post PTCA and stenting of his proximal left anterior descending artery using a 3.5 mm x 23 mm stent. He also has a stent in his distal left anterior descending artery-2.25 mm Taxus stent) she's.  He is also status post PTCA and stenting of the right coronary artery using a 3.0 x 15 mm Primus stent post dilated using a 3.5 mm noncompliant balloon.  He also has a history of hypertension and hyperlipidemia.  He developed a fine petichial rash on his lower legs about 2 months.  He thinks this was the same time that he started a generic Plavix.   [name] done well from a cardiac standpoint. [name] not had any episodes of chest pain or shortness of breath. He did have some volume depletion recently. He may have had a light case of the flu.    Current Outpatient Prescriptions on File Prior to Visit  Medication Sig Dispense Refill  . clopidogrel (PLAVIX) 75 MG tablet Take 1 tablet (75 mg total) by mouth daily.  90 tablet  2  . DIOVAN HCT 320-12.5 MG per tablet TAKE 1/2 TABLET TWICE A DAY  30 tablet  5  . fenofibrate 160 MG tablet Take 1 tablet (160 mg total) by mouth daily.  30 tablet  5  . PLAVIX 75 MG tablet TAKE 1 TABLET EVERY DAY  90 tablet  1    Allergies  Allergen Reactions  . Lipitor (Atorvastatin Calcium)   . Zocor (Simvastatin)     Intolerant      Past Medical History  Diagnosis Date  . CAD (coronary artery disease)     Status post recent PTCA and stenting of the proximal LAD (3.9mmx23mm Promus stent, post dilated using a 3.75 [place] sprinter) and the distal LAD  . HTN (hypertension)   . Hyperlipidemia     Past Surgical History  Procedure Date  . Coronary stent placement Sep 24, 1998  . Ptca     Status post and stenting of  his left circumflex artery   . Cardiac catheterization     History  Smoking status  . Never Smoker   Smokeless tobacco  . Not on file    History  Alcohol Use  . Yes    Rarely    No family history on file.  Reviw of Systems:  Reviewed in the HPI.  All other systems are negative.  Physical Exam: BP 150/82  Pulse 68  Ht 5\' 9"  (1.753 m)  Wt 196 lb 1.9 oz (88.959 kg)  BMI 28.96 kg/m2 The patient is alert and oriented x 3.  The mood and affect are normal.   Skin: warm and dry.  Color is normal.    HEENT:   Normocephalic/atraumatic. He has no JVD. His mucous membranes are moist. He has normal carotids.  Lungs: His lungs are clear.   Heart:  Regular rate S1-S2. He has no murmurs    Abdomen: Good bowel sounds. There is no hepatosplenomegaly the  Extremities:  No clubbing cyanosis or edema. He has a very fine petechial rash on both legs. This started we changed from name Brand to generic Plavix.  Neuro:  Nonfocal. His gait is normal.     Assessment / Plan:

## 2011-06-07 NOTE — Patient Instructions (Signed)
Your physician wants you to follow-up in: 6 months  You will receive a reminder letter in the mail two months in advance. If you don't receive a letter, please call our office to schedule the follow-up appointment.  Your physician recommends that you return for a FASTING lipid profile: 6 months   

## 2011-06-07 NOTE — Assessment & Plan Note (Signed)
Brandon Roy is doing very well from a cardiac standpoint. [name] not had any episodes of chest pain or shortness breath. He appears to have developed a slight rash we changed from generic Plavix. The rash does not appear to be all that severe he's not having any other complications.  I've asked him to let him know if his rash worsens. This point I don't think that is very severe and not entirely clear that it's due to the generic Plavix.  We can slowly changing to [place] Zocor Atrovent if needed.

## 2011-08-03 DIAGNOSIS — H35379 Puckering of macula, unspecified eye: Secondary | ICD-10-CM | POA: Diagnosis not present

## 2011-08-21 ENCOUNTER — Other Ambulatory Visit: Payer: Self-pay | Admitting: *Deleted

## 2011-08-21 MED ORDER — ROSUVASTATIN CALCIUM 20 MG PO TABS: 20.0000 mg | ORAL_TABLET | Freq: Every day | ORAL | Status: DC

## 2011-10-26 ENCOUNTER — Other Ambulatory Visit: Payer: Self-pay | Admitting: *Deleted

## 2011-10-26 ENCOUNTER — Other Ambulatory Visit: Payer: Self-pay | Admitting: Cardiovascular Disease

## 2011-10-30 ENCOUNTER — Other Ambulatory Visit: Payer: Self-pay | Admitting: *Deleted

## 2011-10-30 MED ORDER — CLOPIDOGREL BISULFATE 75 MG PO TABS: 75.0000 mg | ORAL_TABLET | Freq: Every day | ORAL | Status: DC

## 2011-10-30 NOTE — Telephone Encounter (Signed)
Fax Received. Refill Completed. Brandon Roy (R.M.A)   

## 2011-11-01 DIAGNOSIS — H2510 Age-related nuclear cataract, unspecified eye: Secondary | ICD-10-CM | POA: Diagnosis not present

## 2011-11-13 DIAGNOSIS — E785 Hyperlipidemia, unspecified: Secondary | ICD-10-CM | POA: Diagnosis not present

## 2011-11-13 DIAGNOSIS — D531 Other megaloblastic anemias, not elsewhere classified: Secondary | ICD-10-CM | POA: Diagnosis not present

## 2011-11-13 DIAGNOSIS — I10 Essential (primary) hypertension: Secondary | ICD-10-CM | POA: Diagnosis not present

## 2011-11-13 DIAGNOSIS — Z125 Encounter for screening for malignant neoplasm of prostate: Secondary | ICD-10-CM | POA: Diagnosis not present

## 2011-11-17 ENCOUNTER — Other Ambulatory Visit: Payer: Self-pay | Admitting: *Deleted

## 2011-11-17 MED ORDER — FENOFIBRATE 160 MG PO TABS: 160.0000 mg | ORAL_TABLET | Freq: Every day | ORAL | Status: DC

## 2011-11-17 NOTE — Telephone Encounter (Signed)
Refilled fenofibrate

## 2011-11-20 DIAGNOSIS — I2510 Atherosclerotic heart disease of native coronary artery without angina pectoris: Secondary | ICD-10-CM | POA: Diagnosis not present

## 2011-11-20 DIAGNOSIS — Z0000 Encounter for general adult medical examination without abnormal findings: Secondary | ICD-10-CM | POA: Diagnosis not present

## 2011-11-20 DIAGNOSIS — Z125 Encounter for screening for malignant neoplasm of prostate: Secondary | ICD-10-CM | POA: Diagnosis not present

## 2011-11-20 DIAGNOSIS — M1990 Unspecified osteoarthritis, unspecified site: Secondary | ICD-10-CM | POA: Diagnosis not present

## 2011-11-21 DIAGNOSIS — Z1212 Encounter for screening for malignant neoplasm of rectum: Secondary | ICD-10-CM | POA: Diagnosis not present

## 2011-12-06 ENCOUNTER — Ambulatory Visit (INDEPENDENT_AMBULATORY_CARE_PROVIDER_SITE_OTHER): Payer: Medicare Other | Admitting: Cardiovascular Disease

## 2011-12-06 ENCOUNTER — Other Ambulatory Visit (INDEPENDENT_AMBULATORY_CARE_PROVIDER_SITE_OTHER): Payer: Medicare Other

## 2011-12-06 ENCOUNTER — Encounter: Payer: Self-pay | Admitting: Cardiovascular Disease

## 2011-12-06 VITALS — BP 135/70 | HR 71 | Ht 68.0 in | Wt 193.8 lb

## 2011-12-06 DIAGNOSIS — E785 Hyperlipidemia, unspecified: Secondary | ICD-10-CM

## 2011-12-06 DIAGNOSIS — I2510 Atherosclerotic heart disease of native coronary artery without angina pectoris: Secondary | ICD-10-CM

## 2011-12-06 LAB — LIPID PANEL
HDL: 47.8 mg/dL (ref >39.00)
Total CHOL/HDL Ratio: 3

## 2011-12-06 LAB — HEPATIC FUNCTION PANEL
AST: 24 U/L (ref 0–37)
Bilirubin, Direct: 0.1 mg/dL (ref 0.0–0.3)
Total Bilirubin: 0.7 mg/dL (ref 0.3–1.2)

## 2011-12-06 LAB — BASIC METABOLIC PANEL
BUN: 21 mg/dL (ref 6–23)
CO2: 24 mEq/L (ref 19–32)
Calcium: 9.2 mg/dL (ref 8.4–10.5)
Creatinine, Ser: 1 mg/dL (ref 0.4–1.5)
GFR: 78.19 mL/min (ref >60.00)
Glucose, Bld: 98 mg/dL (ref 70–99)
Sodium: 141 mEq/L (ref 135–145)

## 2011-12-06 MED ORDER — ASPIRIN 325 MG PO TABS: 162.0000 mg | ORAL_TABLET | Freq: Every day | ORAL | Status: DC

## 2011-12-06 NOTE — Assessment & Plan Note (Signed)
Brandon Roy is doing very well. We will have him decrease his aspirin from 325-162 mg a day. He has already had his fasting lipids drawn today. I'll see him again in 6 months for office visit and fasting labs.  Have encouraged him to continue with the diet and exercise program.

## 2011-12-06 NOTE — Patient Instructions (Signed)
Your physician has recommended you make the following change in your medication:   Reduce aspirin to 162 mg daily  Your physician wants you to follow-up in: 6 months You will receive a reminder letter in the mail two months in advance. If you don't receive a letter, please call our office to schedule the follow-up appointment.  Your physician recommends that you return for a FASTING lipid profile: 6 months

## 2011-12-06 NOTE — Progress Notes (Signed)
Brandon Roy Date of Birth  1934/07/26       [facility]    [employer] [address]   [address] [place], [place]  [address]    [place], [place]  [address] [phone]     [phone]   Fax  [phone]     Fax [phone]  Problem List: 1. Coronary artery disease-status post PTCA and stenting of his proximal LAD using a 3.5 x 23 mm and distal LAD stent using a 2.25 mm Taxus stent. [name] also status post stenting of the right coronary artery using a 3.0 x 15 mm Promus stent dilated with a 3.5 mm noncompliant balloon. 2. Hypertension 3. Hyperlipidemia  History of Present Illness:  Brandon Roy is a 78 year old gentleman with a history of coronary artery disease. Status post PTCA and stenting of his proximal left anterior descending artery using a 3.5 mm x 23 mm stent. He also has a stent in his distal left anterior descending artery-2.25 mm Taxus stent) she's.  He is also status post PTCA and stenting of the right coronary artery using a 3.0 x 15 mm Primus stent post dilated using a 3.5 mm noncompliant balloon.  He also has a history of hypertension and hyperlipidemia.  His pharmacy changed him to generic Diovan/HCT. Following this he developed an itchy rash. He went back to the Brand name Diovan and feels much better.  Current Outpatient Prescriptions on File Prior to Visit  Medication Sig Dispense Refill  . clopidogrel (PLAVIX) 75 MG tablet Take 1 tablet (75 mg total) by mouth daily.  90 tablet  3  . DIOVAN HCT 320-12.5 MG per tablet TAKE 1/2 TABLET TWICE A DAY  30 tablet  5  . fenofibrate 160 MG tablet Take 1 tablet (160 mg total) by mouth daily.  90 tablet  1  . metoprolol tartrate (LOPRESSOR) 50 MG tablet Take 0.5 tablets (25 mg total) by mouth 2 (two) times daily.  90 tablet  3  . rosuvastatin (CRESTOR) 20 MG tablet Take 1 tablet (20 mg total) by mouth daily.  30 tablet  6  . DISCONTD: metoprolol tartrate (LOPRESSOR) 25 MG tablet Take 1  tablet (25 mg total) by mouth 2 (two) times daily.  60 tablet  2    Allergies  Allergen Reactions  . Lipitor (Atorvastatin Calcium)   . Valsartan-Hydrochlorothiazide     The generic valsartan - HCTZ caused him to itch  . Zocor (Simvastatin)     Intolerant      Past Medical History  Diagnosis Date  . CAD (coronary artery disease)     Status post recent PTCA and stenting of the proximal LAD (3.3mmx23mm Promus stent, post dilated using a 3.75 [place] sprinter) and the distal LAD  . HTN (hypertension)   . Hyperlipidemia     Past Surgical History  Procedure Date  . Coronary stent placement Sep 24, 1998  . Ptca     Status post and stenting of his left circumflex artery   . Cardiac catheterization     History  Smoking status  . Never Smoker   Smokeless tobacco  . Not on file    History  Alcohol Use  . Yes    Rarely    No family history on file.  Reviw of Systems:  Reviewed in the HPI.  All other systems are negative.  Physical Exam: Blood pressure 135/70, pulse 71, height 5\' 8"  (1.727 m), weight 193 lb 12.8 oz (87.907  kg). General: Well developed, well nourished, in no acute distress.  Head: Normocephalic, atraumatic, sclera non-icteric, mucus membranes are moist,   Neck: Supple. Carotids are 2 + without bruits. No JVD  Lungs: Clear bilaterally to auscultation.  Heart: regular rate.  normal  S1 S2. No murmurs, gallops or rubs.  Abdomen: Soft, non-tender, non-distended with normal bowel sounds. No hepatomegaly. No rebound/guarding. No masses.  Msk:  Strength and tone are normal  Extremities: No clubbing or cyanosis. No edema.  Distal pedal pulses are 2+ and equal bilaterally.  Neuro: Alert and oriented X 3. Moves all extremities spontaneously. Gait is normal.  Psych:  Responds to questions appropriately with a normal affect.  ECG: Dec 06, 2011-normal sinus rhythm at 67 beats a minute. He has a first degree AV block. EKG is otherwise normal.  Assessment / Plan:

## 2012-04-26 ENCOUNTER — Other Ambulatory Visit: Payer: Self-pay | Admitting: Cardiovascular Disease

## 2012-04-26 MED ORDER — VALSARTAN-HYDROCHLOROTHIAZIDE 320-12.5 MG PO TABS: 0.5000 | ORAL_TABLET | Freq: Two times a day (BID) | ORAL | Status: DC

## 2012-04-26 NOTE — Telephone Encounter (Signed)
rx sent in to pharmacy. Pt notified

## 2012-05-03 ENCOUNTER — Other Ambulatory Visit: Payer: Self-pay | Admitting: *Deleted

## 2012-05-03 MED ORDER — ROSUVASTATIN CALCIUM 20 MG PO TABS: 20.0000 mg | ORAL_TABLET | Freq: Every day | ORAL | Status: DC

## 2012-05-03 NOTE — Telephone Encounter (Signed)
Fax Received. Refill Completed. Brandon Roy (R.M.A)   

## 2012-05-03 NOTE — Telephone Encounter (Signed)
Opened in Error.

## 2012-06-11 ENCOUNTER — Other Ambulatory Visit: Payer: Self-pay | Admitting: *Deleted

## 2012-06-11 MED ORDER — METOPROLOL TARTRATE 50 MG PO TABS: 25.0000 mg | ORAL_TABLET | Freq: Two times a day (BID) | ORAL | Status: DC

## 2012-06-11 NOTE — Telephone Encounter (Signed)
Fax Received. Refill Completed. Moo Gravley Chowoe (R.M.A)   

## 2012-08-29 ENCOUNTER — Telehealth: Payer: Self-pay | Admitting: *Deleted

## 2012-08-29 DIAGNOSIS — I10 Essential (primary) hypertension: Secondary | ICD-10-CM

## 2012-08-29 MED ORDER — HYDROCHLOROTHIAZIDE 25 MG PO TABS: 25.0000 mg | ORAL_TABLET | Freq: Every day | ORAL | Status: DC

## 2012-08-29 MED ORDER — POTASSIUM CHLORIDE CRYS ER 10 MEQ PO TBCR: 10.0000 meq | EXTENDED_RELEASE_TABLET | Freq: Every day | ORAL | Status: DC

## 2012-08-29 NOTE — Telephone Encounter (Signed)
Insurance would not cover diovan hctz. meds changed, lab date set for bmet pt informed and verbalized understanding

## 2012-09-26 ENCOUNTER — Telehealth: Payer: Self-pay | Admitting: Cardiovascular Disease

## 2012-09-26 ENCOUNTER — Telehealth: Payer: Self-pay | Admitting: *Deleted

## 2012-09-26 MED ORDER — VALSARTAN-HYDROCHLOROTHIAZIDE 320-12.5 MG PO TABS: 1.0000 | ORAL_TABLET | Freq: Every day | ORAL | Status: DC

## 2012-09-26 NOTE — Telephone Encounter (Signed)
[facility] calling to see if Brandon Roy can have the generic of Diovan HCTZ but it is not on his list. I let the pharmacy know I will forward to Nurse and Dr for generic ok.   Micki Riley, CMA

## 2012-09-26 NOTE — Telephone Encounter (Signed)
See note

## 2012-09-26 NOTE — Telephone Encounter (Signed)
Spoke with pt and pharmacy/ janice. 08/29/12 (see note) insurance said they would not pay for diovan so it was stopped and changed to k+ with hctz. Pt didn't start those meds, pt will take generic diovan/hctz, orders placed and MAR adjusted, lab date given to check k+. Pt aware and verbalized understanding.

## 2012-09-26 NOTE — Telephone Encounter (Signed)
He may have generic diovan / HCTZ .

## 2012-09-26 NOTE — Telephone Encounter (Signed)
DONE

## 2012-09-26 NOTE — Telephone Encounter (Signed)
New Problem:    Patient called in wanting to know the status of his request for the generic of Diovan.  Please call back if yuo have any questions.

## 2012-10-01 ENCOUNTER — Other Ambulatory Visit: Payer: Medicare Other

## 2012-10-09 ENCOUNTER — Other Ambulatory Visit: Payer: Medicare Other

## 2012-10-16 ENCOUNTER — Other Ambulatory Visit: Payer: Self-pay | Admitting: *Deleted

## 2012-10-16 MED ORDER — FENOFIBRATE 160 MG PO TABS: 160.0000 mg | ORAL_TABLET | Freq: Every day | ORAL | Status: DC

## 2012-10-16 NOTE — Telephone Encounter (Signed)
lm for pt to call office to make an appointment. Fax Received. Refill Completed. Brandon Roy (R.M.A)

## 2012-11-19 ENCOUNTER — Encounter: Payer: Self-pay | Admitting: Cardiovascular Disease

## 2012-11-19 DIAGNOSIS — I2510 Atherosclerotic heart disease of native coronary artery without angina pectoris: Secondary | ICD-10-CM | POA: Diagnosis not present

## 2012-11-19 DIAGNOSIS — E538 Deficiency of other specified B group vitamins: Secondary | ICD-10-CM | POA: Diagnosis not present

## 2012-11-19 DIAGNOSIS — I10 Essential (primary) hypertension: Secondary | ICD-10-CM | POA: Diagnosis not present

## 2012-11-19 DIAGNOSIS — Z125 Encounter for screening for malignant neoplasm of prostate: Secondary | ICD-10-CM | POA: Diagnosis not present

## 2012-11-19 DIAGNOSIS — E785 Hyperlipidemia, unspecified: Secondary | ICD-10-CM | POA: Diagnosis not present

## 2012-11-21 ENCOUNTER — Encounter: Payer: Self-pay | Admitting: Cardiology

## 2012-11-21 DIAGNOSIS — I2510 Atherosclerotic heart disease of native coronary artery without angina pectoris: Secondary | ICD-10-CM | POA: Diagnosis not present

## 2012-11-21 DIAGNOSIS — Z1331 Encounter for screening for depression: Secondary | ICD-10-CM | POA: Diagnosis not present

## 2012-11-21 DIAGNOSIS — K439 Ventral hernia without obstruction or gangrene: Secondary | ICD-10-CM | POA: Diagnosis not present

## 2012-11-21 DIAGNOSIS — Z125 Encounter for screening for malignant neoplasm of prostate: Secondary | ICD-10-CM | POA: Diagnosis not present

## 2012-11-21 DIAGNOSIS — M1990 Unspecified osteoarthritis, unspecified site: Secondary | ICD-10-CM | POA: Diagnosis not present

## 2012-11-21 DIAGNOSIS — Z0000 Encounter for general adult medical examination without abnormal findings: Secondary | ICD-10-CM | POA: Diagnosis not present

## 2012-11-21 DIAGNOSIS — H3530 Unspecified macular degeneration: Secondary | ICD-10-CM | POA: Diagnosis not present

## 2012-11-21 DIAGNOSIS — I10 Essential (primary) hypertension: Secondary | ICD-10-CM | POA: Diagnosis not present

## 2012-11-21 DIAGNOSIS — E538 Deficiency of other specified B group vitamins: Secondary | ICD-10-CM | POA: Diagnosis not present

## 2012-11-21 DIAGNOSIS — E785 Hyperlipidemia, unspecified: Secondary | ICD-10-CM | POA: Diagnosis not present

## 2012-11-22 DIAGNOSIS — Z1212 Encounter for screening for malignant neoplasm of rectum: Secondary | ICD-10-CM | POA: Diagnosis not present

## 2012-12-04 DIAGNOSIS — H2510 Age-related nuclear cataract, unspecified eye: Secondary | ICD-10-CM | POA: Diagnosis not present

## 2012-12-05 DIAGNOSIS — H2510 Age-related nuclear cataract, unspecified eye: Secondary | ICD-10-CM | POA: Diagnosis not present

## 2012-12-05 DIAGNOSIS — H25019 Cortical age-related cataract, unspecified eye: Secondary | ICD-10-CM | POA: Diagnosis not present

## 2012-12-05 DIAGNOSIS — H35379 Puckering of macula, unspecified eye: Secondary | ICD-10-CM | POA: Diagnosis not present

## 2012-12-18 ENCOUNTER — Other Ambulatory Visit: Payer: Self-pay | Admitting: *Deleted

## 2012-12-18 MED ORDER — CLOPIDOGREL BISULFATE 75 MG PO TABS: 75.0000 mg | ORAL_TABLET | Freq: Every day | ORAL | Status: DC

## 2012-12-24 DIAGNOSIS — H25019 Cortical age-related cataract, unspecified eye: Secondary | ICD-10-CM | POA: Diagnosis not present

## 2012-12-24 DIAGNOSIS — H2589 Other age-related cataract: Secondary | ICD-10-CM | POA: Diagnosis not present

## 2012-12-24 DIAGNOSIS — H2510 Age-related nuclear cataract, unspecified eye: Secondary | ICD-10-CM | POA: Diagnosis not present

## 2013-01-02 ENCOUNTER — Encounter: Payer: Self-pay | Admitting: Cardiovascular Disease

## 2013-01-02 ENCOUNTER — Ambulatory Visit (INDEPENDENT_AMBULATORY_CARE_PROVIDER_SITE_OTHER): Payer: Medicare Other | Admitting: Cardiovascular Disease

## 2013-01-02 VITALS — BP 128/70 | HR 71 | Ht 68.0 in | Wt 192.0 lb

## 2013-01-02 DIAGNOSIS — I2510 Atherosclerotic heart disease of native coronary artery without angina pectoris: Secondary | ICD-10-CM | POA: Diagnosis not present

## 2013-01-02 DIAGNOSIS — E785 Hyperlipidemia, unspecified: Secondary | ICD-10-CM

## 2013-01-02 MED ORDER — ROSUVASTATIN CALCIUM 20 MG PO TABS: 20.0000 mg | ORAL_TABLET | Freq: Every day | ORAL | Status: DC

## 2013-01-02 MED ORDER — FENOFIBRATE 160 MG PO TABS: 160.0000 mg | ORAL_TABLET | Freq: Every day | ORAL | Status: DC

## 2013-01-02 MED ORDER — CLOPIDOGREL BISULFATE 75 MG PO TABS: 75.0000 mg | ORAL_TABLET | Freq: Every day | ORAL | Status: DC

## 2013-01-02 NOTE — Assessment & Plan Note (Addendum)
His LDL is 90.  He will fax [place] his most recent labs. Will increase his crestor from 10 to 20 mg a day.  Check labs in 3 months and again in 1 year.

## 2013-01-02 NOTE — Progress Notes (Signed)
Brandon Roy Date of Birth  1934/06/02       [facility]    [employer] [place] [address]   [address] [place], [place]  [address]    [place], [place]  [address] [phone]     [phone]   Fax  [phone]     Fax [phone]  Problem List: 1. Coronary artery disease-status post PTCA and stenting of his proximal LAD using a 3.5 x 23 mm and distal LAD stent using a 2.25 mm Taxus stent. [name] also status post stenting of the right coronary artery using a 3.0 x 15 mm Promus stent dilated with a 3.5 mm noncompliant balloon. 2. Hypertension 3. Hyperlipidemia  History of Present Illness:  Brandon Roy is a 79 year old gentleman with a history of coronary artery disease. Status post PTCA and stenting of his proximal left anterior descending artery using a 3.5 mm x 23 mm stent. He also has a stent in his distal left anterior descending artery-2.25 mm Taxus stent) she's.  He is also status post PTCA and stenting of the right coronary artery using a 3.0 x 15 mm Primus stent post dilated using a 3.5 mm noncompliant balloon.  He also has a history of hypertension and hyperlipidemia. His pharmacy changed him to generic Diovan/HCT. Following this he developed an itchy rash. He went back to the Brand name Diovan and feels much better.  January 02, 2013:  Brandon Roy is doing well.  No CP.  Staying busy.     Current Outpatient Prescriptions on File Prior to Visit  Medication Sig Dispense Refill  . aspirin 325 MG tablet Take 0.5 tablets (162 mg total) by mouth daily.      . clopidogrel (PLAVIX) 75 MG tablet Take 1 tablet (75 mg total) by mouth daily.  90 tablet  0  . Cyanocobalamin (VITAMIN B-12 PO) Take by mouth daily.      . fenofibrate 160 MG tablet Take 1 tablet (160 mg total) by mouth daily.  90 tablet  0  . metoprolol (LOPRESSOR) 50 MG tablet Take 0.5 tablets (25 mg total) by mouth 2 (two) times daily.  90 tablet  3  . rosuvastatin (CRESTOR) 20 MG tablet Take 1  tablet (20 mg total) by mouth daily.  30 tablet  6  . valsartan-hydrochlorothiazide (DIOVAN-HCT) 320-12.5 MG per tablet Take 1 tablet by mouth daily.  30 tablet  11   No current facility-administered medications on file prior to visit.    Allergies  Allergen Reactions  . Lipitor (Atorvastatin Calcium)   . Valsartan-Hydrochlorothiazide     The generic valsartan - HCTZ caused him to itch  . Zocor (Simvastatin)     Intolerant      Past Medical History  Diagnosis Date  . CAD (coronary artery disease)     Status post recent PTCA and stenting of the proximal LAD (3.59mmx23mm Promus stent, post dilated using a 3.75 [place] sprinter) and the distal LAD  . HTN (hypertension)   . Hyperlipidemia     Past Surgical History  Procedure Laterality Date  . Coronary stent placement  Sep 24, 1998  . Ptca      Status post and stenting of his left circumflex artery   . Cardiac catheterization      History  Smoking status  . Never Smoker   Smokeless tobacco  . Not on file    History  Alcohol Use  . Yes    Comment: Rarely  No family history on file.  Reviw of Systems:  Reviewed in the HPI.  All other systems are negative.  Physical Exam: Blood pressure 128/70, pulse 71, height 5\' 8"  (1.727 m), weight 192 lb (87.091 kg). General: Well developed, well nourished, in no acute distress.  Head: Normocephalic, atraumatic, sclera non-icteric, mucus membranes are moist,   Neck: Supple. Carotids are 2 + without bruits. No JVD  Lungs: Clear bilaterally to auscultation.  Heart: regular rate.  normal  S1 S2. No murmurs, gallops or rubs.  Abdomen: Soft, non-tender, non-distended with normal bowel sounds. No hepatomegaly. No rebound/guarding. No masses.  Msk:  Strength and tone are normal  Extremities: No clubbing or cyanosis. No edema.  Distal pedal pulses are 2+ and equal bilaterally.  Neuro: Alert and oriented X 3. Moves all extremities spontaneously. Gait is normal.  Psych:  Responds  to questions appropriately with a normal affect.  ECG: January 02, 2013: -normal sinus rhythm at 71 beats a minute. He has a first degree AV block. NS T wave abnormality.  Assessment / Plan:

## 2013-01-02 NOTE — Assessment & Plan Note (Signed)
Brandon Roy is doing well.  No angina. No dyspnea.  He is still very active - mows his lawn with the push mower on a regular basis.   Will see him in 1 year.

## 2013-01-02 NOTE — Patient Instructions (Addendum)
Your physician wants you to follow-up in: 1 year  You will receive a reminder letter in the mail two months in advance. If you don't receive a letter, please call our office to schedule the follow-up appointment.  Your physician has recommended you make the following change in your medication:   Increase crestor to 20 mg daily  Your physician recommends that you return for a FASTING lipid profile: in 3 months and 1 year

## 2013-01-29 ENCOUNTER — Telehealth: Payer: Self-pay | Admitting: Cardiovascular Disease

## 2013-01-29 NOTE — Telephone Encounter (Signed)
New problem  Pt wants to speak with you , he would not tell me what it was concerning.

## 2013-01-29 NOTE — Telephone Encounter (Signed)
LMTCB

## 2013-02-03 ENCOUNTER — Ambulatory Visit
Admission: RE | Admit: 2013-02-03 | Discharge: 2013-02-03 | Disposition: A | Discharge status: Home / Self Care | Payer: Medicare Other | Source: Ambulatory Visit | Attending: Internal Medicine | Admitting: Internal Medicine

## 2013-02-03 ENCOUNTER — Other Ambulatory Visit: Payer: Self-pay | Admitting: Internal Medicine

## 2013-02-03 DIAGNOSIS — N433 Hydrocele, unspecified: Secondary | ICD-10-CM | POA: Diagnosis not present

## 2013-02-03 DIAGNOSIS — Z6828 Body mass index (BMI) 28.0-28.9, adult: Secondary | ICD-10-CM | POA: Diagnosis not present

## 2013-02-03 DIAGNOSIS — N508 Other specified disorders of male genital organs: Secondary | ICD-10-CM | POA: Diagnosis not present

## 2013-02-25 DIAGNOSIS — N433 Hydrocele, unspecified: Secondary | ICD-10-CM | POA: Diagnosis not present

## 2013-02-25 DIAGNOSIS — N4340 Spermatocele of epididymis, unspecified: Secondary | ICD-10-CM | POA: Diagnosis not present

## 2013-02-25 DIAGNOSIS — D4010 Neoplasm of uncertain behavior of unspecified testis: Secondary | ICD-10-CM | POA: Diagnosis not present

## 2013-04-07 ENCOUNTER — Other Ambulatory Visit (INDEPENDENT_AMBULATORY_CARE_PROVIDER_SITE_OTHER): Payer: Medicare Other

## 2013-04-07 DIAGNOSIS — I2510 Atherosclerotic heart disease of native coronary artery without angina pectoris: Secondary | ICD-10-CM | POA: Diagnosis not present

## 2013-04-07 DIAGNOSIS — E785 Hyperlipidemia, unspecified: Secondary | ICD-10-CM

## 2013-04-07 LAB — LIPID PANEL
Cholesterol: 141 mg/dL (ref 0–200)
LDL Cholesterol: 80 mg/dL (ref 0–99)
Triglycerides: 110 mg/dL (ref 0.0–149.0)

## 2013-04-07 LAB — BASIC METABOLIC PANEL
Calcium: 9.2 mg/dL (ref 8.4–10.5)
Chloride: 110 mEq/L (ref 96–112)
Creatinine, Ser: 0.9 mg/dL (ref 0.4–1.5)

## 2013-04-07 LAB — HEPATIC FUNCTION PANEL
AST: 19 U/L (ref 0–37)
Albumin: 3.8 g/dL (ref 3.5–5.2)
Alkaline Phosphatase: 33 U/L — ABNORMAL LOW (ref 39–117)
Total Protein: 7.2 g/dL (ref 6.0–8.3)

## 2013-05-20 ENCOUNTER — Other Ambulatory Visit: Payer: Self-pay

## 2013-05-20 MED ORDER — VALSARTAN-HYDROCHLOROTHIAZIDE 320-12.5 MG PO TABS: 1.0000 | ORAL_TABLET | Freq: Every day | ORAL | Status: DC

## 2013-05-27 DIAGNOSIS — L570 Actinic keratosis: Secondary | ICD-10-CM | POA: Diagnosis not present

## 2013-05-27 DIAGNOSIS — L821 Other seborrheic keratosis: Secondary | ICD-10-CM | POA: Diagnosis not present

## 2013-05-27 DIAGNOSIS — D1801 Hemangioma of skin and subcutaneous tissue: Secondary | ICD-10-CM | POA: Diagnosis not present

## 2013-07-01 DIAGNOSIS — N433 Hydrocele, unspecified: Secondary | ICD-10-CM | POA: Diagnosis not present

## 2013-07-01 DIAGNOSIS — D4010 Neoplasm of uncertain behavior of unspecified testis: Secondary | ICD-10-CM | POA: Diagnosis not present

## 2013-08-18 ENCOUNTER — Other Ambulatory Visit: Payer: Self-pay

## 2013-08-18 MED ORDER — METOPROLOL TARTRATE 50 MG PO TABS: 25.0000 mg | ORAL_TABLET | Freq: Two times a day (BID) | ORAL | Status: DC

## 2013-11-25 DIAGNOSIS — E785 Hyperlipidemia, unspecified: Secondary | ICD-10-CM | POA: Diagnosis not present

## 2013-11-25 DIAGNOSIS — I2510 Atherosclerotic heart disease of native coronary artery without angina pectoris: Secondary | ICD-10-CM | POA: Diagnosis not present

## 2013-11-25 DIAGNOSIS — E538 Deficiency of other specified B group vitamins: Secondary | ICD-10-CM | POA: Diagnosis not present

## 2013-11-25 DIAGNOSIS — Z125 Encounter for screening for malignant neoplasm of prostate: Secondary | ICD-10-CM | POA: Diagnosis not present

## 2013-11-25 DIAGNOSIS — I10 Essential (primary) hypertension: Secondary | ICD-10-CM | POA: Diagnosis not present

## 2013-11-25 DIAGNOSIS — R82998 Other abnormal findings in urine: Secondary | ICD-10-CM | POA: Diagnosis not present

## 2013-12-02 DIAGNOSIS — I2510 Atherosclerotic heart disease of native coronary artery without angina pectoris: Secondary | ICD-10-CM | POA: Diagnosis not present

## 2013-12-02 DIAGNOSIS — E538 Deficiency of other specified B group vitamins: Secondary | ICD-10-CM | POA: Diagnosis not present

## 2013-12-02 DIAGNOSIS — K439 Ventral hernia without obstruction or gangrene: Secondary | ICD-10-CM | POA: Diagnosis not present

## 2013-12-02 DIAGNOSIS — H3530 Unspecified macular degeneration: Secondary | ICD-10-CM | POA: Diagnosis not present

## 2013-12-02 DIAGNOSIS — E785 Hyperlipidemia, unspecified: Secondary | ICD-10-CM | POA: Diagnosis not present

## 2013-12-02 DIAGNOSIS — I10 Essential (primary) hypertension: Secondary | ICD-10-CM | POA: Diagnosis not present

## 2013-12-02 DIAGNOSIS — N508 Other specified disorders of male genital organs: Secondary | ICD-10-CM | POA: Diagnosis not present

## 2013-12-02 DIAGNOSIS — Z1331 Encounter for screening for depression: Secondary | ICD-10-CM | POA: Diagnosis not present

## 2013-12-02 DIAGNOSIS — Z125 Encounter for screening for malignant neoplasm of prostate: Secondary | ICD-10-CM | POA: Diagnosis not present

## 2013-12-02 DIAGNOSIS — Z0000 Encounter for general adult medical examination without abnormal findings: Secondary | ICD-10-CM | POA: Diagnosis not present

## 2013-12-03 DIAGNOSIS — Z1212 Encounter for screening for malignant neoplasm of rectum: Secondary | ICD-10-CM | POA: Diagnosis not present

## 2014-01-05 ENCOUNTER — Ambulatory Visit (INDEPENDENT_AMBULATORY_CARE_PROVIDER_SITE_OTHER): Payer: Medicare Other | Admitting: Cardiovascular Disease

## 2014-01-05 ENCOUNTER — Encounter: Payer: Self-pay | Admitting: Cardiovascular Disease

## 2014-01-05 VITALS — BP 120/70 | HR 59 | Ht 68.0 in | Wt 194.0 lb

## 2014-01-05 DIAGNOSIS — I2510 Atherosclerotic heart disease of native coronary artery without angina pectoris: Secondary | ICD-10-CM

## 2014-01-05 LAB — BASIC METABOLIC PANEL
BUN: 15 mg/dL (ref 6–23)
CHLORIDE: 107 meq/L (ref 96–112)
CO2: 28 mEq/L (ref 19–32)
Calcium: 9.8 mg/dL (ref 8.4–10.5)
Creatinine, Ser: 1 mg/dL (ref 0.4–1.5)
GFR: 76.86 mL/min (ref >60.00)
Glucose, Bld: 90 mg/dL (ref 70–99)
POTASSIUM: 4 meq/L (ref 3.5–5.1)
SODIUM: 140 meq/L (ref 135–145)

## 2014-01-05 LAB — HEPATIC FUNCTION PANEL
ALBUMIN: 4.1 g/dL (ref 3.5–5.2)
ALT: 23 U/L (ref 0–53)
AST: 25 U/L (ref 0–37)
Alkaline Phosphatase: 30 U/L — ABNORMAL LOW (ref 39–117)
Bilirubin, Direct: 0.2 mg/dL (ref 0.0–0.3)
TOTAL PROTEIN: 6.7 g/dL (ref 6.0–8.3)
Total Bilirubin: 0.8 mg/dL (ref 0.2–1.2)

## 2014-01-05 LAB — LIPID PANEL
CHOL/HDL RATIO: 3
CHOLESTEROL: 133 mg/dL (ref 0–200)
HDL: 41.4 mg/dL (ref >39.00)
LDL CALC: 69 mg/dL (ref 0–99)
NonHDL: 91.6
TRIGLYCERIDES: 114 mg/dL (ref 0.0–149.0)
VLDL: 22.8 mg/dL (ref 0.0–40.0)

## 2014-01-05 MED ORDER — ASPIRIN 81 MG PO TABS: 81.0000 mg | ORAL_TABLET | Freq: Every day | ORAL | Status: AC

## 2014-01-05 NOTE — Assessment & Plan Note (Signed)
Brandon Roy is doing very well. [name] not had any episodes of chest pain. We will check his fasting lipids, liver enzymes, and basic metabolic profile today. I've encouraged him to continue walking on regular basis.  I will see him in 1 year

## 2014-01-05 NOTE — Progress Notes (Signed)
[name] Date of Birth  October 05, 1933       [facility]    [employer] [address], [place], [place]  [address]    [place], [place]  [address] [phone]     [phone]   Fax  [phone]     Fax [phone]  Problem List: 1. Coronary artery disease-status post PTCA and stenting of his proximal LAD using a 3.5 x 23 mm and distal LAD stent using a 2.25 mm Taxus stent. [name] also status post stenting of the right coronary artery using a 3.0 x 15 mm Promus stent dilated with a 3.5 mm noncompliant balloon. 2. Hypertension 3. Hyperlipidemia  History of Present Illness:  Kashten is a 80 year old gentleman with a history of coronary artery disease. Status post PTCA and stenting of his proximal left anterior descending artery using a 3.5 mm x 23 mm stent. He also has a stent in his distal left anterior descending artery-2.25 mm Taxus stent) she's.  He is also status post PTCA and stenting of the right coronary artery using a 3.0 x 15 mm Primus stent post dilated using a 3.5 mm noncompliant balloon.  He also has a history of hypertension and hyperlipidemia. His pharmacy changed him to generic Diovan/HCT. Following this he developed an itchy rash. He went back to the Brand name Diovan and feels much better.  January 02, 2013:  Nathanie is doing well.  No CP.  Staying busy.    01/05/2014:  Kanen is doing ok.  Able to do all of his normal activities. [name] not having any chest pain.   Current Outpatient Prescriptions on File Prior to Visit  Medication Sig Dispense Refill  . aspirin 325 MG tablet Take 0.5 tablets (162 mg total) by mouth daily.      . clopidogrel (PLAVIX) 75 MG tablet Take 1 tablet (75 mg total) by mouth daily.  90 tablet  3  . Cyanocobalamin (VITAMIN B-12 PO) Take by mouth daily.      . metoprolol (LOPRESSOR) 50 MG tablet Take 0.5 tablets (25 mg total) by mouth 2 (two) times daily.  90 tablet  3  .  valsartan-hydrochlorothiazide (DIOVAN-HCT) 320-12.5 MG per tablet Take 1 tablet by mouth daily.  30 tablet  6  . fenofibrate 160 MG tablet Take 1 tablet (160 mg total) by mouth daily.  90 tablet  3  . rosuvastatin (CRESTOR) 20 MG tablet Take 1 tablet (20 mg total) by mouth daily.  90 tablet  3   No current facility-administered medications on file prior to visit.    Allergies  Allergen Reactions  . Lipitor [Atorvastatin Calcium]   . Valsartan-Hydrochlorothiazide     The generic valsartan - HCTZ caused him to itch  . Zocor [Simvastatin]     Intolerant      Past Medical History  Diagnosis Date  . CAD (coronary artery disease)     Status post recent PTCA and stenting of the proximal LAD (3.54mmx23mm Promus stent, post dilated using a 3.75 [place] sprinter) and the distal LAD  . HTN (hypertension)   . Hyperlipidemia     Past Surgical History  Procedure Laterality Date  . Coronary stent placement  Sep 24, 1998  . Ptca      Status post and stenting of his left circumflex artery   . Cardiac catheterization      History  Smoking status  . Never Smoker   Smokeless tobacco  .  Not on file    History  Alcohol Use  . Yes    Comment: Rarely    No family history on file.  Reviw of Systems:  Reviewed in the HPI.  All other systems are negative.  Physical Exam: Blood pressure 120/70, pulse 59, height 5\' 8"  (1.727 m), weight 194 lb (87.998 kg). General: Well developed, well nourished, in no acute distress.  Head: Normocephalic, atraumatic, sclera non-icteric, mucus membranes are moist,   Neck: Supple. Carotids are 2 + without bruits. No JVD  Lungs: Clear bilaterally to auscultation.  Heart: regular rate.  normal  S1 S2. No murmurs, gallops or rubs.  Abdomen: Soft, non-tender, non-distended with normal bowel sounds. No hepatomegaly. No rebound/guarding. No masses.  Msk:  Strength and tone are normal  Extremities: No clubbing or cyanosis. No edema.  Distal pedal pulses are 2+  and equal bilaterally.  Neuro: Alert and oriented X 3. Moves all extremities spontaneously. Gait is normal.  Psych:  Responds to questions appropriately with a normal affect.  ECG: January 05, 2014: -Sinus bradycardia at 59. He has a first degree AV block. He has occasional premature atrial contractions. He has nonspecific ST and T wave abdominal disease.  Assessment / Plan:

## 2014-01-05 NOTE — Patient Instructions (Signed)
Your physician has recommended you make the following change in your medication:  1. Decrease Asprin 81 mg 1 tab daily  Your physician recommends that you have lab work today : LIPID,LIVER,BMET  Your physician wants you to follow-up in: 1 year with Dr. Vilinda Boehringer will receive a reminder letter in the mail two months in advance. If you don't receive a letter, please call our office to schedule the follow-up appointment.

## 2014-01-25 ENCOUNTER — Other Ambulatory Visit: Payer: Self-pay | Admitting: Cardiovascular Disease

## 2014-02-03 DIAGNOSIS — Z961 Presence of intraocular lens: Secondary | ICD-10-CM | POA: Diagnosis not present

## 2014-02-03 DIAGNOSIS — H2510 Age-related nuclear cataract, unspecified eye: Secondary | ICD-10-CM | POA: Diagnosis not present

## 2014-02-13 DIAGNOSIS — H2510 Age-related nuclear cataract, unspecified eye: Secondary | ICD-10-CM | POA: Diagnosis not present

## 2014-02-13 DIAGNOSIS — H35379 Puckering of macula, unspecified eye: Secondary | ICD-10-CM | POA: Diagnosis not present

## 2014-03-02 ENCOUNTER — Other Ambulatory Visit: Payer: Self-pay | Admitting: Cardiovascular Disease

## 2014-03-22 ENCOUNTER — Other Ambulatory Visit: Payer: Self-pay | Admitting: Cardiovascular Disease

## 2014-03-24 DIAGNOSIS — H25019 Cortical age-related cataract, unspecified eye: Secondary | ICD-10-CM | POA: Diagnosis not present

## 2014-03-24 DIAGNOSIS — H2510 Age-related nuclear cataract, unspecified eye: Secondary | ICD-10-CM | POA: Diagnosis not present

## 2014-03-24 DIAGNOSIS — H2589 Other age-related cataract: Secondary | ICD-10-CM | POA: Diagnosis not present

## 2014-04-20 DIAGNOSIS — H35379 Puckering of macula, unspecified eye: Secondary | ICD-10-CM | POA: Diagnosis not present

## 2014-04-22 ENCOUNTER — Other Ambulatory Visit: Payer: Self-pay | Admitting: Cardiovascular Disease

## 2014-05-06 ENCOUNTER — Other Ambulatory Visit: Payer: Self-pay | Admitting: Cardiovascular Disease

## 2014-05-16 ENCOUNTER — Other Ambulatory Visit: Payer: Self-pay | Admitting: Cardiovascular Disease

## 2014-06-01 ENCOUNTER — Other Ambulatory Visit: Payer: Self-pay | Admitting: Cardiovascular Disease

## 2014-11-13 ENCOUNTER — Other Ambulatory Visit: Payer: Self-pay

## 2014-11-13 MED ORDER — METOPROLOL TARTRATE 50 MG PO TABS: 25.0000 mg | ORAL_TABLET | Freq: Two times a day (BID) | ORAL | Status: DC

## 2014-12-01 DIAGNOSIS — I2510 Atherosclerotic heart disease of native coronary artery without angina pectoris: Secondary | ICD-10-CM | POA: Diagnosis not present

## 2014-12-01 DIAGNOSIS — Z125 Encounter for screening for malignant neoplasm of prostate: Secondary | ICD-10-CM | POA: Diagnosis not present

## 2014-12-01 DIAGNOSIS — I10 Essential (primary) hypertension: Secondary | ICD-10-CM | POA: Diagnosis not present

## 2014-12-01 DIAGNOSIS — E785 Hyperlipidemia, unspecified: Secondary | ICD-10-CM | POA: Diagnosis not present

## 2014-12-01 DIAGNOSIS — Z008 Encounter for other general examination: Secondary | ICD-10-CM | POA: Diagnosis not present

## 2014-12-01 DIAGNOSIS — E538 Deficiency of other specified B group vitamins: Secondary | ICD-10-CM | POA: Diagnosis not present

## 2014-12-07 DIAGNOSIS — I2510 Atherosclerotic heart disease of native coronary artery without angina pectoris: Secondary | ICD-10-CM | POA: Diagnosis not present

## 2014-12-07 DIAGNOSIS — Z1389 Encounter for screening for other disorder: Secondary | ICD-10-CM | POA: Diagnosis not present

## 2014-12-07 DIAGNOSIS — D692 Other nonthrombocytopenic purpura: Secondary | ICD-10-CM | POA: Diagnosis not present

## 2014-12-07 DIAGNOSIS — Z0000 Encounter for general adult medical examination without abnormal findings: Secondary | ICD-10-CM | POA: Diagnosis not present

## 2014-12-07 DIAGNOSIS — E785 Hyperlipidemia, unspecified: Secondary | ICD-10-CM | POA: Diagnosis not present

## 2014-12-07 DIAGNOSIS — I10 Essential (primary) hypertension: Secondary | ICD-10-CM | POA: Diagnosis not present

## 2014-12-07 DIAGNOSIS — E538 Deficiency of other specified B group vitamins: Secondary | ICD-10-CM | POA: Diagnosis not present

## 2014-12-07 DIAGNOSIS — Z6829 Body mass index (BMI) 29.0-29.9, adult: Secondary | ICD-10-CM | POA: Diagnosis not present

## 2014-12-07 DIAGNOSIS — Z1212 Encounter for screening for malignant neoplasm of rectum: Secondary | ICD-10-CM | POA: Diagnosis not present

## 2014-12-07 DIAGNOSIS — I119 Hypertensive heart disease without heart failure: Secondary | ICD-10-CM | POA: Diagnosis not present

## 2014-12-07 DIAGNOSIS — H3530 Unspecified macular degeneration: Secondary | ICD-10-CM | POA: Diagnosis not present

## 2014-12-07 DIAGNOSIS — N508 Other specified disorders of male genital organs: Secondary | ICD-10-CM | POA: Diagnosis not present

## 2014-12-07 DIAGNOSIS — M1990 Unspecified osteoarthritis, unspecified site: Secondary | ICD-10-CM | POA: Diagnosis not present

## 2014-12-09 DIAGNOSIS — Z23 Encounter for immunization: Secondary | ICD-10-CM | POA: Diagnosis not present

## 2014-12-09 DIAGNOSIS — E538 Deficiency of other specified B group vitamins: Secondary | ICD-10-CM | POA: Diagnosis not present

## 2014-12-17 DIAGNOSIS — N433 Hydrocele, unspecified: Secondary | ICD-10-CM | POA: Diagnosis not present

## 2015-02-04 ENCOUNTER — Encounter: Payer: Self-pay | Admitting: Cardiovascular Disease

## 2015-02-04 ENCOUNTER — Ambulatory Visit (INDEPENDENT_AMBULATORY_CARE_PROVIDER_SITE_OTHER): Payer: Medicare Other | Admitting: Cardiovascular Disease

## 2015-02-04 VITALS — BP 120/72 | HR 59 | Ht 68.0 in | Wt 194.6 lb

## 2015-02-04 DIAGNOSIS — I10 Essential (primary) hypertension: Secondary | ICD-10-CM | POA: Diagnosis not present

## 2015-02-04 DIAGNOSIS — I2510 Atherosclerotic heart disease of native coronary artery without angina pectoris: Secondary | ICD-10-CM | POA: Diagnosis not present

## 2015-02-04 DIAGNOSIS — E785 Hyperlipidemia, unspecified: Secondary | ICD-10-CM | POA: Diagnosis not present

## 2015-02-04 LAB — BASIC METABOLIC PANEL
BUN: 18 mg/dL (ref 6–23)
CALCIUM: 9.6 mg/dL (ref 8.4–10.5)
CHLORIDE: 106 meq/L (ref 96–112)
CO2: 23 meq/L (ref 19–32)
CREATININE: 0.85 mg/dL (ref 0.40–1.50)
GFR: 92.46 mL/min (ref >60.00)
Glucose, Bld: 98 mg/dL (ref 70–99)
Potassium: 4 mEq/L (ref 3.5–5.1)
Sodium: 139 mEq/L (ref 135–145)

## 2015-02-04 LAB — HEPATIC FUNCTION PANEL
ALK PHOS: 31 U/L — AB (ref 39–117)
ALT: 19 U/L (ref 0–53)
AST: 20 U/L (ref 0–37)
Albumin: 4 g/dL (ref 3.5–5.2)
BILIRUBIN DIRECT: 0.1 mg/dL (ref 0.0–0.3)
BILIRUBIN TOTAL: 0.6 mg/dL (ref 0.2–1.2)
TOTAL PROTEIN: 6.9 g/dL (ref 6.0–8.3)

## 2015-02-04 LAB — LIPID PANEL
CHOLESTEROL: 146 mg/dL (ref 0–200)
HDL: 42.4 mg/dL (ref >39.00)
LDL CALC: 84 mg/dL (ref 0–99)
NonHDL: 103.6
Total CHOL/HDL Ratio: 3
Triglycerides: 97 mg/dL (ref 0.0–149.0)
VLDL: 19.4 mg/dL (ref 0.0–40.0)

## 2015-02-04 NOTE — Progress Notes (Signed)
Brandon Roy Date of Birth  Oct 03, 1933       [facility]    [employer] [address], [place], [place]  [address]    [place], [place]  [address] [phone]     [phone]   Fax  [phone]     Fax [phone]  Problem List: 1. Coronary artery disease-status post PTCA and stenting of his proximal LAD using a 3.5 x 23 mm and distal LAD stent using a 2.25 mm Taxus stent. [name] also status post stenting of the right coronary artery using a 3.0 x 15 mm Promus stent dilated with a 3.5 mm noncompliant balloon. 2. Hypertension 3. Hyperlipidemia  History of Present Illness:  Brandon Roy is a 81 year old gentleman with a history of coronary artery disease. Status post PTCA and stenting of his proximal left anterior descending artery using a 3.5 mm x 23 mm stent. He also has a stent in his distal left anterior descending artery-2.25 mm Taxus stent) she's.  He is also status post PTCA and stenting of the right coronary artery using a 3.0 x 15 mm Primus stent post dilated using a 3.5 mm noncompliant balloon.  He also has a history of hypertension and hyperlipidemia. His pharmacy changed him to generic Diovan/HCT. Following this he developed an itchy rash. He went back to the Brand name Diovan and feels much better.  January 02, 2013:  Brandon Roy is doing well.  No CP.  Staying busy.    01/05/2014:  Brandon Roy is doing ok.  Able to do all of his normal activities. [name] not having any chest pain.  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     Current Outpatient Prescriptions on File Prior to Visit  Medication Sig Dispense Refill  . aspirin 81 MG tablet Take 1 tablet (81 mg total) by mouth daily.    . clopidogrel (PLAVIX) 75 MG tablet TAKE 1 TABLET (75 MG TOTAL) BY MOUTH DAILY. 90 tablet 2  . CRESTOR 20 MG tablet TAKE 1 TABLET (20 MG TOTAL) BY MOUTH DAILY. 90 tablet 2  . Cyanocobalamin (VITAMIN B-12 PO) Take by  mouth daily.    . fenofibrate 160 MG tablet TAKE 1 TABLET (160 MG TOTAL) BY MOUTH DAILY. 90 tablet 1  . metoprolol (LOPRESSOR) 50 MG tablet Take 0.5 tablets (25 mg total) by mouth 2 (two) times daily. 90 tablet 3  . valsartan-hydrochlorothiazide (DIOVAN-HCT) 320-12.5 MG per tablet TAKE 1 TABLET BY MOUTH DAILY. 30 tablet 11   No current facility-administered medications on file prior to visit.    Allergies  Allergen Reactions  . Lipitor [Atorvastatin Calcium]   . Valsartan-Hydrochlorothiazide     The generic valsartan - HCTZ caused him to itch  . Zocor [Simvastatin]     Intolerant      Past Medical History  Diagnosis Date  . CAD (coronary artery disease)     Status post recent PTCA and stenting of the proximal LAD (3.93mmx23mm Promus stent, post dilated using a 3.75 [place] sprinter) and the distal LAD  . HTN (hypertension)   . Hyperlipidemia     Past Surgical History  Procedure Laterality Date  . Coronary stent placement  Sep 24, 1998  . Ptca      Status post and stenting of his left circumflex artery   . Cardiac catheterization      History  Smoking status  .  Never Smoker   Smokeless tobacco  . Not on file    History  Alcohol Use  . Yes    Comment: Rarely    Family History  Problem Relation Age of Onset  . Heart attack Father     Reviw of Systems:  Reviewed in the HPI.  All other systems are negative.  Physical Exam: Blood pressure 120/72, pulse 59, height 5\' 8"  (1.727 m), weight 88.27 kg (194 lb 9.6 oz). General: Well developed, well nourished, in no acute distress.  Head: Normocephalic, atraumatic, sclera non-icteric, mucus membranes are moist,   Neck: Supple. Carotids are 2 + without bruits. No JVD  Lungs: Clear bilaterally to auscultation.  Heart: regular rate.  normal  S1 S2. No murmurs, gallops or rubs.  Abdomen: Soft, non-tender, non-distended with normal bowel sounds. No hepatomegaly. No rebound/guarding. No masses.  Msk:  Strength and tone are  normal  Extremities: No clubbing or cyanosis. No edema.  Distal pedal pulses are 2+ and equal bilaterally.  Neuro: Alert and oriented X 3. Moves all extremities spontaneously. Gait is normal.  Psych:  Responds to questions appropriately with a normal affect.  ECG: February 04, 2015:  Sinus brady at 61.  1st degree  TWI in I, L,   Assessment / Plan:    1. Coronary artery disease-status post PTCA and stenting of his proximal LAD using a 3.5 x 23 mm and distal LAD stent using a 2.25 mm Taxus stent. [name] also status post stenting of the right coronary artery using a 3.0 x 15 mm Promus stent dilated with a 3.5 mm noncompliant balloon.  Doing well , no agnina . Continue meds.  Lipids look good   2. Hypertension - BP is well controlled.   3. Hyperlipidemia - lipids are well controlled. Will check labs.      Nahser, Wonda Cheng, MD  02/04/2015 9:52 AM    [place],  [place], [place]  [address] Pager [phone] Phone: [phone]; Fax: [phone]   [facility]  [address] [place], [place]  [address] [phone]    Fax [phone]

## 2015-02-04 NOTE — Patient Instructions (Signed)

## 2015-04-22 DIAGNOSIS — Z0100 Encounter for examination of eyes and vision without abnormal findings: Secondary | ICD-10-CM | POA: Diagnosis not present

## 2015-04-22 DIAGNOSIS — Z961 Presence of intraocular lens: Secondary | ICD-10-CM | POA: Diagnosis not present

## 2015-05-30 ENCOUNTER — Other Ambulatory Visit: Payer: Self-pay | Admitting: Cardiovascular Disease

## 2015-06-25 ENCOUNTER — Other Ambulatory Visit: Payer: Self-pay | Admitting: Cardiovascular Disease

## 2015-07-03 ENCOUNTER — Other Ambulatory Visit: Payer: Self-pay | Admitting: Cardiovascular Disease

## 2015-11-15 ENCOUNTER — Other Ambulatory Visit: Payer: Self-pay | Admitting: Cardiovascular Disease

## 2015-12-08 DIAGNOSIS — I10 Essential (primary) hypertension: Secondary | ICD-10-CM | POA: Diagnosis not present

## 2015-12-08 DIAGNOSIS — E784 Other hyperlipidemia: Secondary | ICD-10-CM | POA: Diagnosis not present

## 2015-12-08 DIAGNOSIS — Z125 Encounter for screening for malignant neoplasm of prostate: Secondary | ICD-10-CM | POA: Diagnosis not present

## 2015-12-08 DIAGNOSIS — E538 Deficiency of other specified B group vitamins: Secondary | ICD-10-CM | POA: Diagnosis not present

## 2015-12-14 DIAGNOSIS — I119 Hypertensive heart disease without heart failure: Secondary | ICD-10-CM | POA: Diagnosis not present

## 2015-12-14 DIAGNOSIS — D692 Other nonthrombocytopenic purpura: Secondary | ICD-10-CM | POA: Diagnosis not present

## 2015-12-14 DIAGNOSIS — N5089 Other specified disorders of the male genital organs: Secondary | ICD-10-CM | POA: Diagnosis not present

## 2015-12-14 DIAGNOSIS — H3530 Unspecified macular degeneration: Secondary | ICD-10-CM | POA: Diagnosis not present

## 2015-12-14 DIAGNOSIS — Z0000 Encounter for general adult medical examination without abnormal findings: Secondary | ICD-10-CM | POA: Diagnosis not present

## 2015-12-14 DIAGNOSIS — I10 Essential (primary) hypertension: Secondary | ICD-10-CM | POA: Diagnosis not present

## 2015-12-14 DIAGNOSIS — E538 Deficiency of other specified B group vitamins: Secondary | ICD-10-CM | POA: Diagnosis not present

## 2015-12-14 DIAGNOSIS — I2510 Atherosclerotic heart disease of native coronary artery without angina pectoris: Secondary | ICD-10-CM | POA: Diagnosis not present

## 2015-12-14 DIAGNOSIS — Z6828 Body mass index (BMI) 28.0-28.9, adult: Secondary | ICD-10-CM | POA: Diagnosis not present

## 2015-12-14 DIAGNOSIS — Z1389 Encounter for screening for other disorder: Secondary | ICD-10-CM | POA: Diagnosis not present

## 2015-12-14 DIAGNOSIS — E784 Other hyperlipidemia: Secondary | ICD-10-CM | POA: Diagnosis not present

## 2016-02-04 ENCOUNTER — Encounter: Payer: Self-pay | Admitting: Cardiovascular Disease

## 2016-02-04 ENCOUNTER — Ambulatory Visit (INDEPENDENT_AMBULATORY_CARE_PROVIDER_SITE_OTHER): Payer: Medicare Other | Admitting: Cardiovascular Disease

## 2016-02-04 VITALS — BP 122/76 | HR 56 | Ht 68.0 in | Wt 189.8 lb

## 2016-02-04 DIAGNOSIS — E785 Hyperlipidemia, unspecified: Secondary | ICD-10-CM

## 2016-02-04 DIAGNOSIS — I2510 Atherosclerotic heart disease of native coronary artery without angina pectoris: Secondary | ICD-10-CM | POA: Diagnosis not present

## 2016-02-04 NOTE — Patient Instructions (Signed)
Medication Instructions:  Your physician recommends that you continue on your current medications as directed. Please refer to the Current Medication list given to you today.   Labwork: None Ordered   Testing/Procedures: None Ordered   Follow-Up: Your physician wants you to follow-up in: 1 year with Dr. Nahser.  You will receive a reminder letter in the mail two months in advance. If you don't receive a letter, please call our office to schedule the follow-up appointment.   If you need a refill on your cardiac medications before your next appointment, please call your pharmacy.   Thank you for choosing [facility]! Ruel Dimmick, RN [phone]    

## 2016-02-04 NOTE — Progress Notes (Signed)
Brandon Roy Date of Birth  12-04-1933       [facility]    [employer] [address] [place], [place], [place]  [address]    [place], [place]  [address] [phone]     [phone]   Fax  [phone]     Fax [phone]  Problem List: 1. Coronary artery disease-status post PTCA and stenting of his proximal LAD using a 3.5 x 23 mm and distal LAD stent using a 2.25 mm Taxus stent. [name] also status post stenting of the right coronary artery using a 3.0 x 15 mm Promus stent dilated with a 3.5 mm noncompliant balloon. 2. Hypertension 3. Hyperlipidemia  History of Present Illness:  Brandon Roy is a 82 year old gentleman with a history of coronary artery disease. Status post PTCA and stenting of his proximal left anterior descending artery using a 3.5 mm x 23 mm stent. He also has a stent in his distal left anterior descending artery-2.25 mm Taxus stent) she's.  He is also status post PTCA and stenting of the right coronary artery using a 3.0 x 15 mm Primus stent post dilated using a 3.5 mm noncompliant balloon.  He also has a history of hypertension and hyperlipidemia. His pharmacy changed him to generic Diovan/HCT. Following this he developed an itchy rash. He went back to the Brand name Diovan and feels much better.  January 02, 2013:  Brandon Roy is doing well.  No CP.  Staying busy.    01/05/2014:  Brandon Roy is doing ok.  Able to do all of his normal activities. [name] not having any chest pain.  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    February 04, 2016:  Staying busy.  No angina     Current Outpatient Prescriptions on File Prior to Visit  Medication Sig Dispense Refill  . aspirin 81 MG tablet Take 1 tablet (81 mg total) by mouth daily.    . clopidogrel (PLAVIX) 75 MG tablet TAKE 1 TABLET (75 MG TOTAL) BY MOUTH DAILY. 90 tablet 2  . CRESTOR 20 MG tablet TAKE 1 TABLET (20 MG TOTAL) BY MOUTH DAILY. 90 tablet  3  . Cyanocobalamin (VITAMIN B-12 PO) Take by mouth daily.    . fenofibrate 160 MG tablet TAKE 1 TABLET (160 MG TOTAL) BY MOUTH DAILY. 90 tablet 1  . fenofibrate 160 MG tablet TAKE 1 TABLET (160 MG TOTAL) BY MOUTH DAILY. 90 tablet 3  . metoprolol (LOPRESSOR) 50 MG tablet TAKE 0.5 TABLETS (25 MG TOTAL) BY MOUTH 2 (TWO) TIMES DAILY. 30 tablet 2  . valsartan-hydrochlorothiazide (DIOVAN-HCT) 320-12.5 MG tablet TAKE 1 TABLET BY MOUTH EVERY DAY 30 tablet 2   No current facility-administered medications on file prior to visit.    Allergies  Allergen Reactions  . Lipitor [Atorvastatin Calcium]   . Valsartan-Hydrochlorothiazide     The generic valsartan - HCTZ caused him to itch  . Zocor [Simvastatin]     Intolerant      Past Medical History  Diagnosis Date  . CAD (coronary artery disease)     Status post recent PTCA and stenting of the proximal LAD (3.69mmx23mm Promus stent, post dilated using a 3.75 [place] sprinter) and the distal LAD  . HTN (hypertension)   . Hyperlipidemia     Past Surgical History  Procedure Laterality Date  . Coronary stent placement  Sep 24, 1998  . Ptca  Status post and stenting of his left circumflex artery   . Cardiac catheterization      History  Smoking status  . Never Smoker   Smokeless tobacco  . Not on file    History  Alcohol Use  . Yes    Comment: Rarely    Family History  Problem Relation Age of Onset  . Heart attack Father     Reviw of Systems:  Reviewed in the HPI.  All other systems are negative.  Physical Exam: Blood pressure 122/76, pulse 56, height 5\' 8"  (1.727 m), weight 189 lb 12.8 oz (86.093 kg). General: Well developed, well nourished, in no acute distress.  Head: Normocephalic, atraumatic, sclera non-icteric, mucus membranes are moist,   Neck: Supple. Carotids are 2 + without bruits. No JVD  Lungs: Clear bilaterally to auscultation.  Heart: regular rate.  normal  S1 S2. No murmurs, gallops or rubs.  Abdomen: Soft,  non-tender, non-distended with normal bowel sounds. No hepatomegaly. No rebound/guarding. No masses.  Msk:  Strength and tone are normal  Extremities: No clubbing or cyanosis. No edema.  Distal pedal pulses are 2+ and equal bilaterally.  Neuro: Alert and oriented X 3. Moves all extremities spontaneously. Gait is normal.  Psych:  Responds to questions appropriately with a normal affect.  ECG: February 04, 2016   Sinus brady at 8.   T wave inversion laterally , no changes.   Assessment / Plan:    1. Coronary artery disease-status post PTCA and stenting of his proximal LAD using a 3.5 x 23 mm and distal LAD stent using a 2.25 mm Taxus stent. [name] also status post stenting of the right coronary artery using a 3.0 x 15 mm Promus stent dilated with a 3.5 mm noncompliant balloon.  Doing well , no agnina . Continue meds.  Lipids look good   2. Hypertension - BP is well controlled.   3. Hyperlipidemia - lipids are well controlled. Will have Dr. Virgina Jock send       Brandon Moores, MD  02/04/2016 9:08 AM    [place],  [place], [place]  [address] Pager [phone] Phone: [phone]; Fax: [phone]   [facility]  [address] [place], [place]  [address] [phone]    Fax [phone]

## 2016-11-04 ENCOUNTER — Other Ambulatory Visit: Payer: Self-pay | Admitting: Cardiovascular Disease

## 2016-12-11 DIAGNOSIS — I10 Essential (primary) hypertension: Secondary | ICD-10-CM | POA: Diagnosis not present

## 2016-12-11 DIAGNOSIS — Z125 Encounter for screening for malignant neoplasm of prostate: Secondary | ICD-10-CM | POA: Diagnosis not present

## 2016-12-11 DIAGNOSIS — E784 Other hyperlipidemia: Secondary | ICD-10-CM | POA: Diagnosis not present

## 2016-12-11 DIAGNOSIS — E538 Deficiency of other specified B group vitamins: Secondary | ICD-10-CM | POA: Diagnosis not present

## 2016-12-18 DIAGNOSIS — R972 Elevated prostate specific antigen [PSA]: Secondary | ICD-10-CM | POA: Diagnosis not present

## 2016-12-18 DIAGNOSIS — Z6828 Body mass index (BMI) 28.0-28.9, adult: Secondary | ICD-10-CM | POA: Diagnosis not present

## 2016-12-18 DIAGNOSIS — I119 Hypertensive heart disease without heart failure: Secondary | ICD-10-CM | POA: Diagnosis not present

## 2016-12-18 DIAGNOSIS — E784 Other hyperlipidemia: Secondary | ICD-10-CM | POA: Diagnosis not present

## 2016-12-18 DIAGNOSIS — M1990 Unspecified osteoarthritis, unspecified site: Secondary | ICD-10-CM | POA: Diagnosis not present

## 2016-12-18 DIAGNOSIS — Z1212 Encounter for screening for malignant neoplasm of rectum: Secondary | ICD-10-CM | POA: Diagnosis not present

## 2016-12-18 DIAGNOSIS — H3530 Unspecified macular degeneration: Secondary | ICD-10-CM | POA: Diagnosis not present

## 2016-12-18 DIAGNOSIS — I2510 Atherosclerotic heart disease of native coronary artery without angina pectoris: Secondary | ICD-10-CM | POA: Diagnosis not present

## 2016-12-18 DIAGNOSIS — Z0000 Encounter for general adult medical examination without abnormal findings: Secondary | ICD-10-CM | POA: Diagnosis not present

## 2016-12-18 DIAGNOSIS — Z1389 Encounter for screening for other disorder: Secondary | ICD-10-CM | POA: Diagnosis not present

## 2016-12-18 DIAGNOSIS — D692 Other nonthrombocytopenic purpura: Secondary | ICD-10-CM | POA: Diagnosis not present

## 2016-12-18 DIAGNOSIS — E538 Deficiency of other specified B group vitamins: Secondary | ICD-10-CM | POA: Diagnosis not present

## 2017-02-16 DIAGNOSIS — E538 Deficiency of other specified B group vitamins: Secondary | ICD-10-CM | POA: Diagnosis not present

## 2017-02-16 DIAGNOSIS — E784 Other hyperlipidemia: Secondary | ICD-10-CM | POA: Diagnosis not present

## 2017-02-16 DIAGNOSIS — R972 Elevated prostate specific antigen [PSA]: Secondary | ICD-10-CM | POA: Diagnosis not present

## 2017-03-26 ENCOUNTER — Encounter: Payer: Self-pay | Admitting: Cardiovascular Disease

## 2017-03-26 ENCOUNTER — Ambulatory Visit (INDEPENDENT_AMBULATORY_CARE_PROVIDER_SITE_OTHER): Payer: Medicare Other | Admitting: Cardiovascular Disease

## 2017-03-26 VITALS — BP 120/70 | HR 54 | Ht 68.0 in | Wt 176.8 lb

## 2017-03-26 DIAGNOSIS — E782 Mixed hyperlipidemia: Secondary | ICD-10-CM

## 2017-03-26 DIAGNOSIS — I2510 Atherosclerotic heart disease of native coronary artery without angina pectoris: Secondary | ICD-10-CM

## 2017-03-26 NOTE — Progress Notes (Signed)
Brandon Roy Date of Birth  12-Mar-1934       [facility]    [employer] [address] [place], [place], [place]  [address]    [place], [place]  [address] [phone]     [phone]   Fax  [phone]     Fax [phone]  Problem List: 1. Coronary artery disease-status post PTCA and stenting of his proximal LAD using a 3.5 x 23 mm and distal LAD stent using a 2.25 mm Taxus stent. [name] also status post stenting of the right coronary artery using a 3.0 x 15 mm Promus stent dilated with a 3.5 mm noncompliant balloon. 2. Hypertension 3. Hyperlipidemia  History of Present Illness:  Brandon Roy is a 83 year old gentleman with a history of coronary artery disease. Status post PTCA and stenting of his proximal left anterior descending artery using a 3.5 mm x 23 mm stent. He also has a stent in his distal left anterior descending artery-2.25 mm Taxus stent) she's.  He is also status post PTCA and stenting of the right coronary artery using a 3.0 x 15 mm Primus stent post dilated using a 3.5 mm noncompliant balloon.  He also has a history of hypertension and hyperlipidemia. His pharmacy changed him to generic Diovan/HCT. Following this he developed an itchy rash. He went back to the Brand name Diovan and feels much better.  January 02, 2013:  Brandon Roy is doing well.  No CP.  Staying busy.    01/05/2014:  Brandon Roy is doing ok.  Able to do all of his normal activities. [name] not having any chest pain.  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    February 04, 2016:  Staying busy.  No angina   Aug. 27, 2018:  Doing well. Staying active.   Going camping with grandson. Lipids are managed by Dr. Virgina Jock.     Current Outpatient Prescriptions on File Prior to Visit  Medication Sig Dispense Refill  . aspirin 81 MG tablet Take 1 tablet (81 mg total) by mouth daily.    . clopidogrel (PLAVIX) 75 MG tablet TAKE 1 TABLET (75  MG TOTAL) BY MOUTH DAILY. 90 tablet 2  . CRESTOR 20 MG tablet TAKE 1 TABLET (20 MG TOTAL) BY MOUTH DAILY. 90 tablet 3  . Cyanocobalamin (VITAMIN B-12 PO) Take by mouth daily.    . fenofibrate 160 MG tablet TAKE 1 TABLET (160 MG TOTAL) BY MOUTH DAILY. 90 tablet 1  . fenofibrate 160 MG tablet TAKE 1 TABLET (160 MG TOTAL) BY MOUTH DAILY. 90 tablet 3  . fenofibrate 160 MG tablet TAKE 1 TABLET DAILY. 90 tablet 0  . metoprolol (LOPRESSOR) 50 MG tablet TAKE (1/2) TABLET TWICE DAILY. 30 tablet 0  . valsartan-hydrochlorothiazide (DIOVAN-HCT) 320-12.5 MG tablet TAKE 1 TABLET BY MOUTH EVERY DAY 30 tablet 2   No current facility-administered medications on file prior to visit.     Allergies  Allergen Reactions  . Lipitor [Atorvastatin Calcium]   . Valsartan-Hydrochlorothiazide     The generic valsartan - HCTZ caused him to itch  . Zocor [Simvastatin]     Intolerant      Past Medical History:  Diagnosis Date  . CAD (coronary artery disease)    Status post recent PTCA and stenting of the proximal LAD (3.35mmx23mm Promus stent, post dilated using a 3.75  sprinter) and the distal LAD  . HTN (  hypertension)   . Hyperlipidemia     Past Surgical History:  Procedure Laterality Date  . CARDIAC CATHETERIZATION    . CORONARY STENT PLACEMENT  Sep 24, 1998  . PTCA     Status post and stenting of his left circumflex artery     History  Smoking Status  . Never Smoker  Smokeless Tobacco  . Never Used    History  Alcohol Use  . Yes    Comment: Rarely    Family History  Problem Relation Age of Onset  . Heart attack Father     Reviw of Systems:  Reviewed in the HPI.  All other systems are negative.  Physical Exam: Blood pressure 120/70, pulse (!) 54, height 5\' 8"  (1.727 m), weight 176 lb 12.8 oz (80.2 kg), SpO2 97 %. General: Well developed, well nourished, in no acute distress.  Head: Normocephalic, atraumatic, sclera non-icteric, mucus membranes are moist,   Neck: Supple. Carotids  are 2 + without bruits. No JVD  Lungs: Clear bilaterally to auscultation.  Heart: regular rate.  normal  S1 S2. No murmurs, gallops or rubs.  Abdomen: Soft, non-tender, non-distended with normal bowel sounds. No hepatomegaly. No rebound/guarding. No masses.  Msk:  Strength and tone are normal  Extremities: No clubbing or cyanosis. No edema.  Distal pedal pulses are 2+ and equal bilaterally.  Neuro: Alert and oriented X 3. Moves all extremities spontaneously. Gait is normal.  Psych:  Responds to questions appropriately with a normal affect.  ECG: Aug. 27, 2018 Sinus brady at 54.   1st degree AV block   Assessment / Plan:    1. Coronary artery disease-status post PTCA and stenting of his proximal LAD using a 3.5 x 23 mm and distal LAD stent using a 2.25 mm Taxus stent. [name] also status post stenting of the right coronary artery using a 3.0 x 15 mm Promus stent dilated with a 3.5 mm noncompliant balloon.  Has done well.  No angina .   Continue current meds and exercise regimine  2. Hypertension - BP is well controlled.   3. Hyperlipidemia - lipids are well controlled. Will have Dr. Virgina Jock send  Labs over     Mertie Moores, MD  03/26/2017 8:41 AM    [place],  [place], [place]  [address] Pager [phone] Phone: [phone]; Fax: [phone]

## 2017-03-26 NOTE — Patient Instructions (Signed)

## 2017-11-01 DIAGNOSIS — H0015 Chalazion left lower eyelid: Secondary | ICD-10-CM | POA: Diagnosis not present

## 2017-12-13 DIAGNOSIS — I10 Essential (primary) hypertension: Secondary | ICD-10-CM | POA: Diagnosis not present

## 2017-12-13 DIAGNOSIS — R82998 Other abnormal findings in urine: Secondary | ICD-10-CM | POA: Diagnosis not present

## 2017-12-13 DIAGNOSIS — E538 Deficiency of other specified B group vitamins: Secondary | ICD-10-CM | POA: Diagnosis not present

## 2017-12-13 DIAGNOSIS — E7849 Other hyperlipidemia: Secondary | ICD-10-CM | POA: Diagnosis not present

## 2017-12-13 DIAGNOSIS — Z125 Encounter for screening for malignant neoplasm of prostate: Secondary | ICD-10-CM | POA: Diagnosis not present

## 2017-12-20 DIAGNOSIS — H3530 Unspecified macular degeneration: Secondary | ICD-10-CM | POA: Diagnosis not present

## 2017-12-20 DIAGNOSIS — Z6828 Body mass index (BMI) 28.0-28.9, adult: Secondary | ICD-10-CM | POA: Diagnosis not present

## 2017-12-20 DIAGNOSIS — M1990 Unspecified osteoarthritis, unspecified site: Secondary | ICD-10-CM | POA: Diagnosis not present

## 2017-12-20 DIAGNOSIS — D692 Other nonthrombocytopenic purpura: Secondary | ICD-10-CM | POA: Diagnosis not present

## 2017-12-20 DIAGNOSIS — E538 Deficiency of other specified B group vitamins: Secondary | ICD-10-CM | POA: Diagnosis not present

## 2017-12-20 DIAGNOSIS — E7849 Other hyperlipidemia: Secondary | ICD-10-CM | POA: Diagnosis not present

## 2017-12-20 DIAGNOSIS — Z125 Encounter for screening for malignant neoplasm of prostate: Secondary | ICD-10-CM | POA: Diagnosis not present

## 2017-12-20 DIAGNOSIS — I2510 Atherosclerotic heart disease of native coronary artery without angina pectoris: Secondary | ICD-10-CM | POA: Diagnosis not present

## 2017-12-20 DIAGNOSIS — I119 Hypertensive heart disease without heart failure: Secondary | ICD-10-CM | POA: Diagnosis not present

## 2017-12-20 DIAGNOSIS — Z1389 Encounter for screening for other disorder: Secondary | ICD-10-CM | POA: Diagnosis not present

## 2017-12-20 DIAGNOSIS — Z0000 Encounter for general adult medical examination without abnormal findings: Secondary | ICD-10-CM | POA: Diagnosis not present

## 2017-12-20 DIAGNOSIS — R972 Elevated prostate specific antigen [PSA]: Secondary | ICD-10-CM | POA: Diagnosis not present

## 2018-05-01 ENCOUNTER — Encounter: Payer: Self-pay | Admitting: Cardiovascular Disease

## 2018-05-01 ENCOUNTER — Ambulatory Visit (INDEPENDENT_AMBULATORY_CARE_PROVIDER_SITE_OTHER): Payer: Medicare Other | Admitting: Cardiovascular Disease

## 2018-05-01 VITALS — BP 120/58 | HR 54 | Ht 68.0 in | Wt 181.0 lb

## 2018-05-01 DIAGNOSIS — R0989 Other specified symptoms and signs involving the circulatory and respiratory systems: Secondary | ICD-10-CM

## 2018-05-01 DIAGNOSIS — I2510 Atherosclerotic heart disease of native coronary artery without angina pectoris: Secondary | ICD-10-CM | POA: Diagnosis not present

## 2018-05-01 MED ORDER — EZETIMIBE 10 MG PO TABS: 10.0000 mg | ORAL_TABLET | Freq: Every day | ORAL | 3 refills | Status: DC

## 2018-05-01 MED ORDER — EZETIMIBE 10 MG PO TABS: 10.0000 mg | ORAL_TABLET | Freq: Every day | ORAL | 11 refills | Status: DC

## 2018-05-01 NOTE — Patient Instructions (Addendum)
Medication Instructions:  Your physician has recommended you make the following change in your medication:   START Zetia (Ezetimibe) 10 mg one caily   Labwork: Your physician recommends that you return for lab work in: 3 months  You will need to FAST for this appointment - nothing to eat or drink after midnight the night before except water.   Testing/Procedures: Your physician has requested that you have a carotid duplex. This test is an ultrasound of the carotid arteries in your neck. It looks at blood flow through these arteries that supply the brain with blood. Allow one hour for this exam. There are no restrictions or special instructions.    Follow-Up: Your physician wants you to follow-up in: 1 year with Dr. Acie Fredrickson. You will receive a reminder letter in the mail two months in advance. If you don't receive a letter, please call our office to schedule the follow-up appointment.   If you need a refill on your cardiac medications before your next appointment, please call your pharmacy.   Thank you for choosing [facility]! Christen Bame, RN [phone]

## 2018-05-01 NOTE — Progress Notes (Signed)
Brandon Roy Date of Birth  04/25/1934       [facility]    [employer] [address], [address], [place], [place]  [address]    [place], [place]  [address] [phone]     [phone]   Fax  [phone]     Fax [phone]  Problem List: 1. Coronary artery disease-status post PTCA and stenting of his proximal LAD using a 3.5 x 23 mm and distal LAD stent using a 2.25 mm Taxus stent. [name] also status post stenting of the right coronary artery using a 3.0 x 15 mm Promus stent dilated with a 3.5 mm noncompliant balloon. 2. Hypertension 3. Hyperlipidemia   Brandon Roy is a 84 year old gentleman with a history of coronary artery disease. Status post PTCA and stenting of his proximal left anterior descending artery using a 3.5 mm x 23 mm stent. He also has a stent in his distal left anterior descending artery-2.25 mm Taxus stent) she's.  He is also status post PTCA and stenting of the right coronary artery using a 3.0 x 15 mm Primus stent post dilated using a 3.5 mm noncompliant balloon.  He also has a history of hypertension and hyperlipidemia. His pharmacy changed him to generic Diovan/HCT. Following this he developed an itchy rash. He went back to the Brand name Diovan and feels much better.  January 02, 2013:  Brandon Roy is doing well.  No CP.  Staying busy.    01/05/2014:  Brandon Roy is doing ok.  Able to do all of his normal activities. [name] not having any chest pain.  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    February 04, 2016:  Staying busy.  No angina   Aug. 27, 2018:  Doing well. Staying active.   Going camping with grandson. Lipids are managed by Dr. Virgina Jock.    Oct. 2, 2019: Doing well.    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    Current Outpatient Medications on File Prior to Visit  Medication Sig Dispense Refill  . aspirin 81 MG tablet Take 1 tablet (81 mg total) by  mouth daily.    . clopidogrel (PLAVIX) 75 MG tablet TAKE 1 TABLET (75 MG TOTAL) BY MOUTH DAILY. 90 tablet 2  . Cyanocobalamin (VITAMIN B-12 PO) Take by mouth daily.    . fenofibrate 160 MG tablet TAKE 1 TABLET (160 MG TOTAL) BY MOUTH DAILY. 90 tablet 3  . hydrochlorothiazide (HYDRODIURIL) 12.5 MG tablet Take 12.5 mg by mouth daily.  2  . irbesartan (AVAPRO) 300 MG tablet Take 300 mg by mouth daily.  2  . metoprolol tartrate (LOPRESSOR) 50 MG tablet Take 25 mg by mouth 2 (two) times daily.    . rosuvastatin (CRESTOR) 20 MG tablet Take 20 mg by mouth daily.     No current facility-administered medications on file prior to visit.     Allergies  Allergen Reactions  . Lipitor [Atorvastatin Calcium]   . Valsartan-Hydrochlorothiazide     The generic valsartan - HCTZ caused him to itch  . Zocor [Simvastatin]     Intolerant      Past Medical History:  Diagnosis Date  . CAD (coronary artery disease)    Status post recent PTCA and stenting of the proximal LAD (3.44mmx23mm Promus stent, post dilated using a 3.75 [place] sprinter) and the distal LAD  . HTN (  hypertension)   . Hyperlipidemia     Past Surgical History:  Procedure Laterality Date  . CARDIAC CATHETERIZATION    . CORONARY STENT PLACEMENT  Sep 24, 1998  . PTCA     Status post and stenting of his left circumflex artery     Social History   Tobacco Use  Smoking Status Never Smoker  Smokeless Tobacco Never Used    Social History   Substance and Sexual Activity  Alcohol Use Yes   Comment: Rarely    Family History  Problem Relation Age of Onset  . Heart attack Father     Reviw of Systems:  Reviewed in the HPI.  All other systems are negative.  Physical Exam: Blood pressure (!) 120/58, pulse (!) 54, height 5\' 8"  (1.727 m), weight 181 lb (82.1 kg), SpO2 97 %.  GEN:  Well nourished, well developed in no acute distress HEENT: Normal NECK: No JVD;  + left carotid bruit  LYMPHATICS: No lymphadenopathy CARDIAC: RRR   no  murmurs, rubs, gallops RESPIRATORY:  Clear to auscultation without rales, wheezing or rhonchi  ABDOMEN: Soft, non-tender, non-distended MUSCULOSKELETAL:  No edema; No deformity  SKIN: Warm and dry NEUROLOGIC:  Alert and oriented x 3   ECG: May 01, 2018: Sinus bradycardia 54.  First-degree AV block.  Nonspecific T wave abnormality.  Assessment / Plan:    1. Coronary artery disease-status post PTCA and stenting of his proximal LAD using a 3.5 x 23 mm and distal LAD stent using a 2.25 mm Taxus stent. [name] also status post stenting of the right coronary artery using a 3.0 x 15 mm Promus stent dilated with a 3.5 mm noncompliant balloon.  No angina . Continue meds   2. Hypertension -   pressures well controlled.  3. Hyperlipidemia -  LDL is still not to goal.  Add Zetia 10 mg a day  Check lipids , liver enz and BMP in 3 months   4.  Carotid bruit:   Will get a carotid duplex scan     Mertie Moores, MD  05/01/2018 1:50 PM    [place],  [place], [place]  [address] Pager [phone] Phone: [phone]; Fax: [phone]

## 2018-05-06 ENCOUNTER — Telehealth: Payer: Self-pay | Admitting: Nurse Practitioner

## 2018-05-06 ENCOUNTER — Ambulatory Visit (HOSPITAL_COMMUNITY)
Admission: RE | Admit: 2018-05-06 | Discharge: 2018-05-06 | Disposition: A | Discharge status: Home / Self Care | Payer: Medicare Other | Source: Ambulatory Visit | Attending: Cardiology | Admitting: Cardiology

## 2018-05-06 DIAGNOSIS — I2510 Atherosclerotic heart disease of native coronary artery without angina pectoris: Secondary | ICD-10-CM | POA: Diagnosis not present

## 2018-05-06 DIAGNOSIS — R0989 Other specified symptoms and signs involving the circulatory and respiratory systems: Secondary | ICD-10-CM | POA: Insufficient documentation

## 2018-05-06 DIAGNOSIS — I6522 Occlusion and stenosis of left carotid artery: Secondary | ICD-10-CM

## 2018-05-06 NOTE — Telephone Encounter (Signed)
Left message for patient to call office for results. °

## 2018-05-06 NOTE — Telephone Encounter (Signed)
Received return call from patient. I reviewed carotid u/s results with patient and advised him that he should receive a call from [facility] to schedule an appointment. I advised him to call back with questions or concerns and he thanked me for the call.  Referral to [facility] placed

## 2018-05-06 NOTE — Telephone Encounter (Signed)
-----   Message from Thayer Headings, MD sent at 05/06/2018 11:01 AM EDT ----- Tight left carotid stenosis Please refer to [facility] for further evaluation

## 2018-05-07 DIAGNOSIS — I10 Essential (primary) hypertension: Secondary | ICD-10-CM | POA: Diagnosis not present

## 2018-05-07 DIAGNOSIS — M542 Cervicalgia: Secondary | ICD-10-CM | POA: Diagnosis not present

## 2018-05-07 DIAGNOSIS — Z6827 Body mass index (BMI) 27.0-27.9, adult: Secondary | ICD-10-CM | POA: Diagnosis not present

## 2018-05-10 NOTE — Telephone Encounter (Signed)
  Patient was told to call back if he has not heard anything regarding referral that was sent [facility]

## 2018-05-10 NOTE — Telephone Encounter (Signed)
Returned call to patient. Patient states that he hs not heard from [facility] yet. Made patient aware that referral was placed on 10/8. Made patient aware that the scheduler at [facility] will not be back until Tuesday and will reach out to him then to schedule appointment. Patient states that he is asymptomatic and understands to contact our office if this changes.

## 2018-05-13 DIAGNOSIS — M542 Cervicalgia: Secondary | ICD-10-CM | POA: Diagnosis not present

## 2018-05-14 ENCOUNTER — Ambulatory Visit (INDEPENDENT_AMBULATORY_CARE_PROVIDER_SITE_OTHER): Payer: Medicare Other | Admitting: Vascular Surgery

## 2018-05-14 ENCOUNTER — Other Ambulatory Visit: Payer: Self-pay

## 2018-05-14 ENCOUNTER — Encounter: Payer: Self-pay | Admitting: Vascular Surgery

## 2018-05-14 ENCOUNTER — Ambulatory Visit (INDEPENDENT_AMBULATORY_CARE_PROVIDER_SITE_OTHER)
Admission: RE | Admit: 2018-05-14 | Discharge: 2018-05-14 | Disposition: A | Discharge status: Home / Self Care | Payer: Medicare Other | Source: Ambulatory Visit | Attending: Vascular Surgery | Admitting: Vascular Surgery

## 2018-05-14 ENCOUNTER — Other Ambulatory Visit: Payer: Self-pay | Admitting: Vascular Surgery

## 2018-05-14 VITALS — BP 118/67 | HR 71 | Temp 97.8°F | Resp 18 | Ht 68.0 in | Wt 173.6 lb

## 2018-05-14 DIAGNOSIS — I6523 Occlusion and stenosis of bilateral carotid arteries: Secondary | ICD-10-CM | POA: Insufficient documentation

## 2018-05-14 NOTE — H&P (View-Only) (Signed)
[facility]ist [facility] [place]  Patient name: Brandon Roy MRN: [id number] DOB: [birth year] Sex: male  REASON FOR CONSULT: Evaluation extracranial cerebrovascular occlusive disease  HPI: Brandon Roy is a 84 y.o. male, who is found to have a left carotid bruit by Dr.Nahser on physical exam.  He underwent a carotid duplex which revealed right internal carotid artery occlusion and critical stenosis of his left internal carotid artery.  He is right-handed.  He is totally asymptomatic.  He has never had any episodes of a aphasia, amaurosis fugax or transient ischemic attack or stroke.  Brandon Roy quite active his age of 89.  Has had coronary stenting in the past.  Past Medical History:  Diagnosis Date  . CAD (coronary artery disease)    Status post recent PTCA and stenting of the proximal LAD (3.7mmx23mm Promus stent, post dilated using a 3.75 [place] sprinter) and the distal LAD  . Carotid artery occlusion   . HTN (hypertension)   . Hyperlipidemia   . Peripheral arterial disease (HCC)     Family History  Problem Relation Age of Onset  . Heart attack Father   . Heart disease Father     SOCIAL HISTORY: Social History   Socioeconomic History  . Marital status: Married    Spouse name: Not on file  . Number of children: Not on file  . Years of education: Not on file  . Highest education level: Not on file  Occupational History  . Not on file  Social Needs  . Financial resource strain: Not on file  . Food insecurity:    Worry: Not on file    Inability: Not on file  . Transportation needs:    Medical: Not on file    Non-medical: Not on file  Tobacco Use  . Smoking status: Former Research scientist (life sciences)  . Smokeless tobacco: Never Used  Substance and Sexual Activity  . Alcohol use: Yes    Comment: Rarely  . Drug use: Never  . Sexual activity: Not on file  Lifestyle  . Physical activity:    Days per week: Not on file    Minutes per session: Not on file    . Stress: Not on file  Relationships  . Social connections:    Talks on phone: Not on file    Gets together: Not on file    Attends religious service: Not on file    Active member of club or organization: Not on file    Attends meetings of clubs or organizations: Not on file    Relationship status: Not on file  . Intimate partner violence:    Fear of current or ex partner: Not on file    Emotionally abused: Not on file    Physically abused: Not on file    Forced sexual activity: Not on file  Other Topics Concern  . Not on file  Social History Narrative  . Not on file    Allergies  Allergen Reactions  . Lipitor [Atorvastatin Calcium]   . Valsartan-Hydrochlorothiazide     The generic valsartan - HCTZ caused him to itch  . Zocor [Simvastatin]     Intolerant      Current Outpatient Medications  Medication Sig Dispense Refill  . aspirin 81 MG tablet Take 1 tablet (81 mg total) by mouth daily.    . clopidogrel (PLAVIX) 75 MG tablet TAKE 1 TABLET (75 MG TOTAL) BY MOUTH DAILY. 90 tablet 2  . Cyanocobalamin (VITAMIN B-12 PO) Take by mouth  daily.    . ezetimibe (ZETIA) 10 MG tablet Take 1 tablet (10 mg total) by mouth daily. 90 tablet 3  . fenofibrate 160 MG tablet TAKE 1 TABLET (160 MG TOTAL) BY MOUTH DAILY. 90 tablet 3  . hydrochlorothiazide (HYDRODIURIL) 12.5 MG tablet Take 12.5 mg by mouth daily.  2  . irbesartan (AVAPRO) 300 MG tablet Take 300 mg by mouth daily.  2  . metoprolol tartrate (LOPRESSOR) 50 MG tablet Take 25 mg by mouth 2 (two) times daily.    . rosuvastatin (CRESTOR) 20 MG tablet Take 20 mg by mouth daily.    . valACYclovir (VALTREX) 1000 MG tablet   0   No current facility-administered medications for this visit.     REVIEW OF SYSTEMS:  [X]  denotes positive finding, [ ]  denotes negative finding Cardiac  Comments:  Chest pain or chest pressure:    Shortness of breath upon exertion:    Short of breath when lying flat:    Irregular heart rhythm:         Vascular    Pain in calf, thigh, or hip brought on by ambulation:    Pain in feet at night that wakes you up from your sleep:     Blood clot in your veins:    Leg swelling:         Pulmonary    Oxygen at home:    Productive cough:     Wheezing:         Neurologic    Sudden weakness in arms or legs:     Sudden numbness in arms or legs:     Sudden onset of difficulty speaking or slurred speech:    Temporary loss of vision in one eye:     Problems with dizziness:         Gastrointestinal    Blood in stool:     Vomited blood:         Genitourinary    Burning when urinating:     Blood in urine:        Psychiatric    Major depression:         Hematologic    Bleeding problems:    Problems with blood clotting too easily:        Skin    Rashes or ulcers:        Constitutional    Fever or chills:      PHYSICAL EXAM: Vitals:   05/14/18 1423 05/14/18 1424  BP: 123/76 118/67  Pulse: 71   Resp: 18   Temp: 97.8 F (36.6 C)   TempSrc: Oral   SpO2: 99%   Weight: 173 lb 9.6 oz (78.7 kg)   Height: 5\' 8"  (1.727 m)     GENERAL: The patient is a well-nourished male, in no acute distress. The vital signs are documented above. CARDIOVASCULAR: Harsh left carotid bruit and no bruit on the right.  2+ radial pulses bilaterally.  2+ femoral pulses bilaterally and palpable posterior tibial pulses bilaterally PULMONARY: There is good air exchange  ABDOMEN: Soft and non-tender  MUSCULOSKELETAL: There are no major deformities or cyanosis. NEUROLOGIC: No focal weakness or paresthesias are detected. SKIN: There are no ulcers or rashes noted. PSYCHIATRIC: The patient has a normal affect.  DATA:  He underwent repeat duplex in our office today to determine if he was a surgical candidate based on duplex alone.  This did show critical stenosis in his left internal carotid and occlusion of his right internal carotid  artery  MEDICAL ISSUES: Long discussion with patient and his wife  present.  I explained the significance of his severe asymptomatic disease.  I have recommended left carotid endarterectomy for reduction of stroke risk.  I explained the procedure including 1 to 2% risk of stroke with surgery.  Also explained there is no surgical treatment for his occluded right internal carotid artery.  We will contact Dr. Elmarie Shiley office to assure no further cardiac evaluation is needed.  He is tentatively scheduled for surgery for 10/18/ 2019   Rosetta Posner, MD [facility] of [facility] Tel [phone] Pager [phone]

## 2018-05-14 NOTE — Progress Notes (Signed)
[facility] and [facility]in Specialist of [place]  Patient name: Brandon Roy MRN: [id number] DOB: 19-Oct-1933 Sex: male  REASON FOR CONSULT: Evaluation extracranial cerebrovascular occlusive disease  HPI: Brandon Roy is a 84 y.o. male, who is found to have a left carotid bruit by Dr.Nahser on physical exam.  He underwent a carotid duplex which revealed right internal carotid artery occlusion and critical stenosis of his left internal carotid artery.  He is right-handed.  He is totally asymptomatic.  He has never had any episodes of a aphasia, amaurosis fugax or transient ischemic attack or stroke.  Jodelle Red quite active his age of 53.  Has had coronary stenting in the past.  Past Medical History:  Diagnosis Date  . CAD (coronary artery disease)    Status post recent PTCA and stenting of the proximal LAD (3.83mmx23mm Promus stent, post dilated using a 3.75 [employer] sprinter) and the distal LAD  . Carotid artery occlusion   . HTN (hypertension)   . Hyperlipidemia   . Peripheral arterial disease (HCC)     Family History  Problem Relation Age of Onset  . Heart attack Father   . Heart disease Father     SOCIAL HISTORY: Social History   Socioeconomic History  . Marital status: Married    Spouse name: Not on file  . Number of children: Not on file  . Years of education: Not on file  . Highest education level: Not on file  Occupational History  . Not on file  Social Needs  . Financial resource strain: Not on file  . Food insecurity:    Worry: Not on file    Inability: Not on file  . Transportation needs:    Medical: Not on file    Non-medical: Not on file  Tobacco Use  . Smoking status: Former Research scientist (life sciences)  . Smokeless tobacco: Never Used  Substance and Sexual Activity  . Alcohol use: Yes    Comment: Rarely  . Drug use: Never  . Sexual activity: Not on file  Lifestyle  . Physical activity:    Days per week: Not on file    Minutes per session: Not on file    . Stress: Not on file  Relationships  . Social connections:    Talks on phone: Not on file    Gets together: Not on file    Attends religious service: Not on file    Active member of club or organization: Not on file    Attends meetings of clubs or organizations: Not on file    Relationship status: Not on file  . Intimate partner violence:    Fear of current or ex partner: Not on file    Emotionally abused: Not on file    Physically abused: Not on file    Forced sexual activity: Not on file  Other Topics Concern  . Not on file  Social History Narrative  . Not on file    Allergies  Allergen Reactions  . Lipitor [Atorvastatin Calcium]   . Valsartan-Hydrochlorothiazide     The generic valsartan - HCTZ caused him to itch  . Zocor [Simvastatin]     Intolerant      Current Outpatient Medications  Medication Sig Dispense Refill  . aspirin 81 MG tablet Take 1 tablet (81 mg total) by mouth daily.    . clopidogrel (PLAVIX) 75 MG tablet TAKE 1 TABLET (75 MG TOTAL) BY MOUTH DAILY. 90 tablet 2  . Cyanocobalamin (VITAMIN B-12 PO) Take by mouth  daily.    . ezetimibe (ZETIA) 10 MG tablet Take 1 tablet (10 mg total) by mouth daily. 90 tablet 3  . fenofibrate 160 MG tablet TAKE 1 TABLET (160 MG TOTAL) BY MOUTH DAILY. 90 tablet 3  . hydrochlorothiazide (HYDRODIURIL) 12.5 MG tablet Take 12.5 mg by mouth daily.  2  . irbesartan (AVAPRO) 300 MG tablet Take 300 mg by mouth daily.  2  . metoprolol tartrate (LOPRESSOR) 50 MG tablet Take 25 mg by mouth 2 (two) times daily.    . rosuvastatin (CRESTOR) 20 MG tablet Take 20 mg by mouth daily.    . valACYclovir (VALTREX) 1000 MG tablet   0   No current facility-administered medications for this visit.     REVIEW OF SYSTEMS:  [X]  denotes positive finding, [ ]  denotes negative finding Cardiac  Comments:  Chest pain or chest pressure:    Shortness of breath upon exertion:    Short of breath when lying flat:    Irregular heart rhythm:         Vascular    Pain in calf, thigh, or hip brought on by ambulation:    Pain in feet at night that wakes you up from your sleep:     Blood clot in your veins:    Leg swelling:         Pulmonary    Oxygen at home:    Productive cough:     Wheezing:         Neurologic    Sudden weakness in arms or legs:     Sudden numbness in arms or legs:     Sudden onset of difficulty speaking or slurred speech:    Temporary loss of vision in one eye:     Problems with dizziness:         Gastrointestinal    Blood in stool:     Vomited blood:         Genitourinary    Burning when urinating:     Blood in urine:        Psychiatric    Major depression:         Hematologic    Bleeding problems:    Problems with blood clotting too easily:        Skin    Rashes or ulcers:        Constitutional    Fever or chills:      PHYSICAL EXAM: Vitals:   05/14/18 1423 05/14/18 1424  BP: 123/76 118/67  Pulse: 71   Resp: 18   Temp: 97.8 F (36.6 C)   TempSrc: Oral   SpO2: 99%   Weight: 173 lb 9.6 oz (78.7 kg)   Height: 5\' 8"  (1.727 m)     GENERAL: The patient is a well-nourished male, in no acute distress. The vital signs are documented above. CARDIOVASCULAR: Harsh left carotid bruit and no bruit on the right.  2+ radial pulses bilaterally.  2+ femoral pulses bilaterally and palpable posterior tibial pulses bilaterally PULMONARY: There is good air exchange  ABDOMEN: Soft and non-tender  MUSCULOSKELETAL: There are no major deformities or cyanosis. NEUROLOGIC: No focal weakness or paresthesias are detected. SKIN: There are no ulcers or rashes noted. PSYCHIATRIC: The patient has a normal affect.  DATA:  He underwent repeat duplex in our office today to determine if he was a surgical candidate based on duplex alone.  This did show critical stenosis in his left internal carotid and occlusion of his right internal carotid  artery  MEDICAL ISSUES: Long discussion with patient and his wife  present.  I explained the significance of his severe asymptomatic disease.  I have recommended left carotid endarterectomy for reduction of stroke risk.  I explained the procedure including 1 to 2% risk of stroke with surgery.  Also explained there is no surgical treatment for his occluded right internal carotid artery.  We will contact Dr. Elmarie Shiley office to assure no further cardiac evaluation is needed.  He is tentatively scheduled for surgery for 10/18/ 2019   Rosetta Posner, MD [facility] Vascular and Vein Specialists of [facility] Tel [phone] Pager [phone]

## 2018-05-16 ENCOUNTER — Encounter (HOSPITAL_COMMUNITY): Payer: Self-pay

## 2018-05-16 ENCOUNTER — Encounter (HOSPITAL_COMMUNITY)
Admission: RE | Admit: 2018-05-16 | Discharge: 2018-05-16 | Disposition: A | Discharge status: Home / Self Care | Payer: Medicare Other | Source: Ambulatory Visit | Attending: Vascular Surgery | Admitting: Vascular Surgery

## 2018-05-16 ENCOUNTER — Other Ambulatory Visit: Payer: Self-pay

## 2018-05-16 HISTORY — DX: Presence of dental prosthetic device (complete) (partial): Z97.2

## 2018-05-16 HISTORY — DX: Zoster without complications: B02.9

## 2018-05-16 HISTORY — DX: Pneumonia, unspecified organism: J18.9

## 2018-05-16 HISTORY — DX: Presence of spectacles and contact lenses: Z97.3

## 2018-05-16 LAB — URINALYSIS, ROUTINE W REFLEX MICROSCOPIC
Bilirubin Urine: NEGATIVE
GLUCOSE, UA: NEGATIVE mg/dL
HGB URINE DIPSTICK: NEGATIVE
Ketones, ur: NEGATIVE mg/dL
Leukocytes, UA: NEGATIVE
Nitrite: NEGATIVE
PROTEIN: NEGATIVE mg/dL
Specific Gravity, Urine: 1.029 (ref 1.005–1.030)
pH: 5 (ref 5.0–8.0)

## 2018-05-16 LAB — COMPREHENSIVE METABOLIC PANEL
ALK PHOS: 29 U/L — AB (ref 38–126)
ALT: 24 U/L (ref 0–44)
AST: 25 U/L (ref 15–41)
Albumin: 4.2 g/dL (ref 3.5–5.0)
Anion gap: 12 (ref 5–15)
BILIRUBIN TOTAL: 0.9 mg/dL (ref 0.3–1.2)
BUN: 27 mg/dL — ABNORMAL HIGH (ref 8–23)
CO2: 26 mmol/L (ref 22–32)
CREATININE: 1.11 mg/dL (ref 0.61–1.24)
Calcium: 9.6 mg/dL (ref 8.9–10.3)
Chloride: 101 mmol/L (ref 98–111)
GFR, EST NON AFRICAN AMERICAN: 60 mL/min — AB (ref >60)
Glucose, Bld: 110 mg/dL — ABNORMAL HIGH (ref 70–99)
Potassium: 3.3 mmol/L — ABNORMAL LOW (ref 3.5–5.1)
Sodium: 139 mmol/L (ref 135–145)
Total Protein: 7.1 g/dL (ref 6.5–8.1)

## 2018-05-16 LAB — CBC
HEMATOCRIT: 46 % (ref 39.0–52.0)
HEMOGLOBIN: 15.2 g/dL (ref 13.0–17.0)
MCH: 29.9 pg (ref 26.0–34.0)
MCHC: 33 g/dL (ref 30.0–36.0)
MCV: 90.6 fL (ref 80.0–100.0)
Platelets: 401 10*3/uL — ABNORMAL HIGH (ref 150–400)
RBC: 5.08 MIL/uL (ref 4.22–5.81)
RDW: 12.7 % (ref 11.5–15.5)
WBC: 8 10*3/uL (ref 4.0–10.5)
nRBC: 0 % (ref 0.0–0.2)

## 2018-05-16 LAB — TYPE AND SCREEN
ABO/RH(D): O POS
Antibody Screen: NEGATIVE

## 2018-05-16 LAB — SURGICAL PCR SCREEN
MRSA, PCR: NEGATIVE
Staphylococcus aureus: NEGATIVE

## 2018-05-16 LAB — PROTIME-INR
INR: 1.05
PROTHROMBIN TIME: 13.7 s (ref 11.4–15.2)

## 2018-05-16 LAB — APTT: aPTT: 30 seconds (ref 24–36)

## 2018-05-16 LAB — ABO/RH: ABO/RH(D): O POS

## 2018-05-16 NOTE — Progress Notes (Signed)
Anesthesia Chart Review:  Case:  [id number] Date/Time:  05/17/18 0907   Procedure:  LEFT ENDARTERECTOMY CAROTID (Left )   Anesthesia type:  Choice   Pre-op diagnosis:  I65.22   Location:  [facility] OR ROOM 22 / [place] OR   Surgeon:  Rosetta Posner, MD      DISCUSSION: Patient is a 84 year old male scheduled for the above procedure. Patient was referred to vascular surgery by his cardiologist Dr. Acie Fredrickson after finding of carotid bruit on exam and 41-66% LICA stenosis and RICA occlusion on Duplex.  History includes CAD (s/p LAD stent '00, DES LCX 05/31/05, DES pLAD and dLAD 08/23/09, DES RCA 08/24/09)   DES pLAD and dLAD), HTN, HLD, PAD, carotid artery disease (> 06% LICA stenosis, occluded RICA).   Patient is staying on ASA and Plavix per [facility].  If no acute changes then I would anticipate that he can proceed as planned.   VS: BP 127/90   Pulse 65   Temp 36.7 C   Resp 18   Ht 5\' 8"  (1.727 m)   Wt 79 kg   SpO2 100%   BMI 26.47 kg/m   PROVIDERS: Shon Baton, MD is PCP Mertie Moores, MD is cardiologist. Last visit 05/01/18.    LABS: Labs reviewed: Acceptable for surgery. (all labs ordered are listed, but only abnormal results are displayed)  Labs Reviewed  CBC - Abnormal; Notable for the following components:      Result Value   Platelets 401 (*)    All other components within normal limits  COMPREHENSIVE METABOLIC PANEL - Abnormal; Notable for the following components:   Potassium 3.3 (*)    Glucose, Bld 110 (*)    BUN 27 (*)    Alkaline Phosphatase 29 (*)    GFR calc non Af Amer 60 (*)    All other components within normal limits  URINALYSIS, ROUTINE W REFLEX MICROSCOPIC - Abnormal; Notable for the following components:   Color, Urine AMBER (*)    All other components within normal limits  SURGICAL PCR SCREEN  APTT  PROTIME-INR  TYPE AND SCREEN  ABO/RH    EKG: 05/01/18: SB at 54 bpm, first degree AV block. Non-specific T wave abnormality.   CV: Carotid U/S  05/14/18: Summary: Right Carotid: Velocities in the right ICA are consistent with a total        occlusion. Left Carotid: Velocities in the left ICA are consistent with a 80-99% stenosis.       Non-hemodynamically significant plaque noted in the CCA. Vertebrals: Bilateral vertebral arteries demonstrate antegrade flow. Abnormal       waveform in the right vertebral. Subclavians: Left subclavian artery was stenotic. Normal flow hemodynamics were       seen in the right subclavian artery.  Echo 05/15/11: Study Conclusions: - Left ventricle: The cavity size was normal.  Wall thickness was normal.  Systolic function was normal.  The estimated ejection fraction was in the range of 55 to 60%.  Wall motion was normal; there were no regional wall motion abnormalities.  Doppler parameters are consistent with abnormal left ventricular relaxation (grade 1 diastolic dysfunction) -Left atrium: The atrium is mildly dilated.  Cardiac cath 08/23/09: LM: Fairly smooth and normal. LAD: Proximal stent with 90% diffuse in-stent restenosis. Mild-to-moderate irregularities throughout the proximal and mid vessel. Distal LAd with 80-90% stenosis. LCX: Large vessel. Proximal stent with minor luminal irregularities. OM1 20-30% irregularities. RCA: Mild irregularities throughout. Distal 70% stenosis. PDA and PLA are normal.  LV: Normal LV function. EF 60-65%. - PTCA/DES dLAD and pLAD 08/23/09. - PTCA/DES dRCA 08/24/09.   Past Medical History:  Diagnosis Date  . CAD (coronary artery disease)    Status post recent PTCA and stenting of the proximal LAD (3.57mmx23mm Promus stent, post dilated using a 3.75 [employer] sprinter) and the distal LAD  . Carotid artery occlusion   . HTN (hypertension)   . Hyperlipidemia   . Peripheral arterial disease ([place])   . Pneumonia   . Shingles   . Wears dentures   . Wears glasses     Past Surgical History:  Procedure Laterality Date  . CARDIAC  CATHETERIZATION    . CATARACT EXTRACTION W/ INTRAOCULAR LENS  IMPLANT, BILATERAL    . COLONOSCOPY    . CORONARY STENT PLACEMENT  Sep 24, 1998  . EYE SURGERY    . MULTIPLE TOOTH EXTRACTIONS    . PTCA     Status post and stenting of his left circumflex artery   . TONSILLECTOMY      MEDICATIONS: . aspirin 81 MG tablet  . clopidogrel (PLAVIX) 75 MG tablet  . ezetimibe (ZETIA) 10 MG tablet  . fenofibrate 160 MG tablet  . hydrochlorothiazide (HYDRODIURIL) 12.5 MG tablet  . irbesartan (AVAPRO) 150 MG tablet  . metoprolol tartrate (LOPRESSOR) 50 MG tablet  . rosuvastatin (CRESTOR) 20 MG tablet  . vitamin B-12 (CYANOCOBALAMIN) 1000 MCG tablet   No current facility-administered medications for this encounter.     [facility] Stay [facility]/Anesthesiology Phone [phone] 05/16/2018 5:37 PM

## 2018-05-16 NOTE — Pre-Procedure Instructions (Addendum)
   Brandon Roy  05/16/2018    [address] [place] [address] Phone: [phone] Fax: [phone]   Your procedure is scheduled on Friday, May 17, 2018  Report to [facility] Admitting at 7:20 A.M.  Call this number if you have problems the morning of surgery:  [phone]   Remember:  Do not eat or drink after midnight.   Take these medicines the morning of surgery with A SIP OF WATER : aspirin, ezetimibe (ZETIA), metoprolol tartrate (LOPRESSOR), clopidogrel (PLAVIX),  fenofibrate rosuvastatin (CRESTOR)  Stop taking vitamins, fish oil and herbal medications. Do not take any NSAIDs ie: Ibuprofen, Advil, Naproxen (Aleve), Motrin, BC and Goody Powder; stop now.  Do not wear jewelry, make-up or nail polish.  Do not wear lotions, powders, or perfumes, or deodorant.  Do not shave 48 hours prior to surgery.  Men may shave face and neck.  Do not bring valuables to the hospital.  [facility] is not responsible for any belongings or valuables.  Contacts, dentures or bridgework may not be worn into surgery.  Leave your suitcase in the car.  After surgery it may be brought to your room. Special instructions: See n" [place]-Preparing For Surgery" sheet. Please read over the following fact sheets that you were given. Pain Booklet, Coughing and Deep Breathing, MRSA Information and Surgical Site Infection Prevention

## 2018-05-16 NOTE — Progress Notes (Signed)
Pt denies SOB and chest pain. Pt stated that he is under the care of Dr. Acie Fredrickson, Cardiology. Pt denies having a chest x ray within the last year. Pt denies recent labs. Zigmund Daniel, Surgical Coordinator at [facility] stated that pt should take Plavix and Aspirin on DOS. Pt stated that he takes Aspirin at HS. Pt verbalized understanding of all pre-op instructions. See anesthesia note.

## 2018-05-17 ENCOUNTER — Inpatient Hospital Stay (HOSPITAL_COMMUNITY): Payer: Medicare Other | Admitting: Vascular Surgery

## 2018-05-17 ENCOUNTER — Encounter (HOSPITAL_COMMUNITY)
Admission: RE | Disposition: A | Discharge status: Home / Self Care | Payer: Self-pay | Source: Home / Self Care | Attending: Vascular Surgery

## 2018-05-17 ENCOUNTER — Inpatient Hospital Stay (HOSPITAL_COMMUNITY): Payer: Medicare Other | Admitting: Anesthesiology

## 2018-05-17 ENCOUNTER — Inpatient Hospital Stay (HOSPITAL_COMMUNITY)
Admission: RE | Admit: 2018-05-17 | Discharge: 2018-05-18 | DRG: 038 | Disposition: A | Discharge status: Home / Self Care | Payer: Medicare Other | Attending: Vascular Surgery | Admitting: Vascular Surgery

## 2018-05-17 ENCOUNTER — Other Ambulatory Visit: Payer: Self-pay

## 2018-05-17 ENCOUNTER — Inpatient Hospital Stay (HOSPITAL_COMMUNITY): Payer: Medicare Other | Admitting: Certified Registered"

## 2018-05-17 ENCOUNTER — Encounter (HOSPITAL_COMMUNITY): Payer: Self-pay | Admitting: *Deleted

## 2018-05-17 DIAGNOSIS — S1093XA Contusion of unspecified part of neck, initial encounter: Secondary | ICD-10-CM | POA: Diagnosis not present

## 2018-05-17 DIAGNOSIS — L7632 Postprocedural hematoma of skin and subcutaneous tissue following other procedure: Secondary | ICD-10-CM | POA: Diagnosis not present

## 2018-05-17 DIAGNOSIS — I6523 Occlusion and stenosis of bilateral carotid arteries: Secondary | ICD-10-CM | POA: Diagnosis not present

## 2018-05-17 DIAGNOSIS — Y831 Surgical operation with implant of artificial internal device as the cause of abnormal reaction of the patient, or of later complication, without mention of misadventure at the time of the procedure: Secondary | ICD-10-CM | POA: Diagnosis not present

## 2018-05-17 DIAGNOSIS — I739 Peripheral vascular disease, unspecified: Secondary | ICD-10-CM | POA: Diagnosis present

## 2018-05-17 DIAGNOSIS — E785 Hyperlipidemia, unspecified: Secondary | ICD-10-CM | POA: Diagnosis not present

## 2018-05-17 DIAGNOSIS — I10 Essential (primary) hypertension: Secondary | ICD-10-CM | POA: Diagnosis present

## 2018-05-17 DIAGNOSIS — I2510 Atherosclerotic heart disease of native coronary artery without angina pectoris: Secondary | ICD-10-CM | POA: Diagnosis present

## 2018-05-17 DIAGNOSIS — Z8249 Family history of ischemic heart disease and other diseases of the circulatory system: Secondary | ICD-10-CM | POA: Diagnosis not present

## 2018-05-17 DIAGNOSIS — I6522 Occlusion and stenosis of left carotid artery: Secondary | ICD-10-CM | POA: Diagnosis not present

## 2018-05-17 DIAGNOSIS — Z7982 Long term (current) use of aspirin: Secondary | ICD-10-CM | POA: Diagnosis not present

## 2018-05-17 DIAGNOSIS — Z7902 Long term (current) use of antithrombotics/antiplatelets: Secondary | ICD-10-CM

## 2018-05-17 DIAGNOSIS — I6529 Occlusion and stenosis of unspecified carotid artery: Secondary | ICD-10-CM | POA: Diagnosis present

## 2018-05-17 DIAGNOSIS — I639 Cerebral infarction, unspecified: Secondary | ICD-10-CM | POA: Diagnosis not present

## 2018-05-17 DIAGNOSIS — I97638 Postprocedural hematoma of a circulatory system organ or structure following other circulatory system procedure: Secondary | ICD-10-CM | POA: Diagnosis not present

## 2018-05-17 HISTORY — PX: PATCH ANGIOPLASTY: SHX6230

## 2018-05-17 HISTORY — PX: HEMATOMA EVACUATION: SHX5118

## 2018-05-17 HISTORY — PX: CAROTID ENDARTERECTOMY: SUR193

## 2018-05-17 HISTORY — PX: ENDARTERECTOMY: SHX5162

## 2018-05-17 LAB — POCT I-STAT 4, (NA,K, GLUC, HGB,HCT)
Glucose, Bld: 98 mg/dL (ref 70–99)
HEMATOCRIT: 43 % (ref 39.0–52.0)
Hemoglobin: 14.6 g/dL (ref 13.0–17.0)
Potassium: 3.3 mmol/L — ABNORMAL LOW (ref 3.5–5.1)
Sodium: 137 mmol/L (ref 135–145)

## 2018-05-17 LAB — POCT I-STAT 7, (LYTES, BLD GAS, ICA,H+H)
Acid-Base Excess: 3 mmol/L — ABNORMAL HIGH (ref 0.0–2.0)
BICARBONATE: 27.1 mmol/L (ref 20.0–28.0)
Calcium, Ion: 1.2 mmol/L (ref 1.15–1.40)
HEMATOCRIT: 35 % — AB (ref 39.0–52.0)
Hemoglobin: 11.9 g/dL — ABNORMAL LOW (ref 13.0–17.0)
O2 SAT: 100 %
PO2 ART: 466 mmHg — AB (ref 83.0–108.0)
POTASSIUM: 3.9 mmol/L (ref 3.5–5.1)
Patient temperature: 35.6
Sodium: 137 mmol/L (ref 135–145)
TCO2: 28 mmol/L (ref 22–32)
pCO2 arterial: 37.4 mmHg (ref 32.0–48.0)
pH, Arterial: 7.463 — ABNORMAL HIGH (ref 7.350–7.450)

## 2018-05-17 LAB — CREATININE, SERUM
Creatinine, Ser: 0.89 mg/dL (ref 0.61–1.24)
GFR calc Af Amer: >60 mL/min (ref >60)
GFR calc non Af Amer: >60 mL/min (ref >60)

## 2018-05-17 LAB — CBC
HEMATOCRIT: 34.9 % — AB (ref 39.0–52.0)
Hemoglobin: 11.8 g/dL — ABNORMAL LOW (ref 13.0–17.0)
MCH: 30.3 pg (ref 26.0–34.0)
MCHC: 33.8 g/dL (ref 30.0–36.0)
MCV: 89.5 fL (ref 80.0–100.0)
PLATELETS: 333 10*3/uL (ref 150–400)
RBC: 3.9 MIL/uL — ABNORMAL LOW (ref 4.22–5.81)
RDW: 12.5 % (ref 11.5–15.5)
WBC: 14 10*3/uL — ABNORMAL HIGH (ref 4.0–10.5)
nRBC: 0 % (ref 0.0–0.2)

## 2018-05-17 SURGERY — ENDARTERECTOMY, CAROTID
Anesthesia: General | Laterality: Left

## 2018-05-17 SURGERY — ENDARTERECTOMY, CAROTID
Anesthesia: General | Site: Neck | Laterality: Left

## 2018-05-17 SURGERY — ENDARTERECTOMY, CAROTID
Anesthesia: Choice | Laterality: Left

## 2018-05-17 MED ORDER — SODIUM CHLORIDE 0.9 % IV SOLN: INTRAVENOUS | Status: DC

## 2018-05-17 MED ORDER — ACETAMINOPHEN 325 MG RE SUPP: 325.0000 mg | RECTAL | Status: DC | PRN

## 2018-05-17 MED ORDER — ROCURONIUM BROMIDE 10 MG/ML (PF) SYRINGE
PREFILLED_SYRINGE | INTRAVENOUS | Status: DC | PRN
  Administered 2018-05-17: 30 mg via INTRAVENOUS
  Administered 2018-05-17: 50 mg via INTRAVENOUS

## 2018-05-17 MED ORDER — FENTANYL CITRATE (PF) 250 MCG/5ML IJ SOLN
INTRAMUSCULAR | Status: AC
  Filled 2018-05-17: qty 5

## 2018-05-17 MED ORDER — PROPOFOL 10 MG/ML IV BOLUS
INTRAVENOUS | Status: DC | PRN
  Administered 2018-05-17: 110 mg via INTRAVENOUS

## 2018-05-17 MED ORDER — ONDANSETRON HCL 4 MG/2ML IJ SOLN
INTRAMUSCULAR | Status: DC | PRN
  Administered 2018-05-17: 4 mg via INTRAVENOUS

## 2018-05-17 MED ORDER — LABETALOL HCL 5 MG/ML IV SOLN: 10.0000 mg | INTRAVENOUS | Status: DC | PRN

## 2018-05-17 MED ORDER — GLYCOPYRROLATE 0.2 MG/ML IJ SOLN
INTRAMUSCULAR | Status: DC | PRN
  Administered 2018-05-17: 0.2 mg via INTRAVENOUS

## 2018-05-17 MED ORDER — ONDANSETRON HCL 4 MG/2ML IJ SOLN
INTRAMUSCULAR | Status: AC
  Filled 2018-05-17: qty 2

## 2018-05-17 MED ORDER — LIDOCAINE HCL (PF) 1 % IJ SOLN
INTRAMUSCULAR | Status: AC
  Filled 2018-05-17: qty 30

## 2018-05-17 MED ORDER — PHENOL 1.4 % MT LIQD: 1.0000 | OROMUCOSAL | Status: DC | PRN

## 2018-05-17 MED ORDER — PHENYLEPHRINE 40 MCG/ML (10ML) SYRINGE FOR IV PUSH (FOR BLOOD PRESSURE SUPPORT)
PREFILLED_SYRINGE | INTRAVENOUS | Status: DC | PRN
  Administered 2018-05-17: 80 ug via INTRAVENOUS

## 2018-05-17 MED ORDER — CEFAZOLIN SODIUM 1 G IJ SOLR
INTRAMUSCULAR | Status: AC
  Filled 2018-05-17: qty 20

## 2018-05-17 MED ORDER — PROTAMINE SULFATE 10 MG/ML IV SOLN
INTRAVENOUS | Status: DC | PRN
  Administered 2018-05-17: 50 mg via INTRAVENOUS

## 2018-05-17 MED ORDER — SODIUM CHLORIDE 0.9 % IV SOLN
0.0125 ug/kg/min | INTRAVENOUS | Status: AC
  Administered 2018-05-17: .1 ug/kg/min via INTRAVENOUS
  Filled 2018-05-17: qty 2000

## 2018-05-17 MED ORDER — VITAMIN B-12 1000 MCG PO TABS
1000.0000 ug | ORAL_TABLET | Freq: Every day | ORAL | Status: DC
  Administered 2018-05-18: 1000 ug via ORAL
  Filled 2018-05-17: qty 1

## 2018-05-17 MED ORDER — ROSUVASTATIN CALCIUM 10 MG PO TABS
20.0000 mg | ORAL_TABLET | Freq: Every day | ORAL | Status: DC
  Administered 2018-05-18: 20 mg via ORAL
  Filled 2018-05-17: qty 2

## 2018-05-17 MED ORDER — CEFAZOLIN SODIUM-DEXTROSE 2-4 GM/100ML-% IV SOLN
2.0000 g | INTRAVENOUS | Status: AC
  Administered 2018-05-17: 2 g via INTRAVENOUS

## 2018-05-17 MED ORDER — FENTANYL CITRATE (PF) 100 MCG/2ML IJ SOLN: 25.0000 ug | INTRAMUSCULAR | Status: DC | PRN

## 2018-05-17 MED ORDER — HEPARIN SODIUM (PORCINE) 1000 UNIT/ML IJ SOLN
INTRAMUSCULAR | Status: DC | PRN
  Administered 2018-05-17: 8000 [IU] via INTRAVENOUS

## 2018-05-17 MED ORDER — OXYCODONE HCL 5 MG PO TABS: 5.0000 mg | ORAL_TABLET | Freq: Once | ORAL | Status: DC | PRN

## 2018-05-17 MED ORDER — OXYCODONE-ACETAMINOPHEN 5-325 MG PO TABS: 1.0000 | ORAL_TABLET | ORAL | Status: DC | PRN

## 2018-05-17 MED ORDER — PANTOPRAZOLE SODIUM 40 MG PO TBEC
40.0000 mg | DELAYED_RELEASE_TABLET | Freq: Every day | ORAL | Status: DC
  Administered 2018-05-17 – 2018-05-18 (×2): 40 mg via ORAL
  Filled 2018-05-17 (×2): qty 1

## 2018-05-17 MED ORDER — SUCCINYLCHOLINE CHLORIDE 200 MG/10ML IV SOSY
PREFILLED_SYRINGE | INTRAVENOUS | Status: DC | PRN
  Administered 2018-05-17: 160 mg via INTRAVENOUS

## 2018-05-17 MED ORDER — MIDAZOLAM HCL 2 MG/2ML IJ SOLN
INTRAMUSCULAR | Status: AC
  Filled 2018-05-17: qty 2

## 2018-05-17 MED ORDER — SODIUM CHLORIDE 0.9 % IV SOLN
INTRAVENOUS | Status: DC | PRN
  Administered 2018-05-17: 15:00:00

## 2018-05-17 MED ORDER — SENNOSIDES-DOCUSATE SODIUM 8.6-50 MG PO TABS: 1.0000 | ORAL_TABLET | Freq: Every evening | ORAL | Status: DC | PRN

## 2018-05-17 MED ORDER — LIDOCAINE 2% (20 MG/ML) 5 ML SYRINGE
INTRAMUSCULAR | Status: DC | PRN
  Administered 2018-05-17: 80 mg via INTRAVENOUS

## 2018-05-17 MED ORDER — SODIUM CHLORIDE 0.9 % IV SOLN
INTRAVENOUS | Status: DC
  Administered 2018-05-17 – 2018-05-18 (×2): via INTRAVENOUS

## 2018-05-17 MED ORDER — DEXAMETHASONE SODIUM PHOSPHATE 10 MG/ML IJ SOLN
INTRAMUSCULAR | Status: DC | PRN
  Administered 2018-05-17: 10 mg via INTRAVENOUS

## 2018-05-17 MED ORDER — BISACODYL 5 MG PO TBEC: 5.0000 mg | DELAYED_RELEASE_TABLET | Freq: Every day | ORAL | Status: DC | PRN

## 2018-05-17 MED ORDER — ACETAMINOPHEN 325 MG PO TABS: 325.0000 mg | ORAL_TABLET | ORAL | Status: DC | PRN

## 2018-05-17 MED ORDER — SUCCINYLCHOLINE CHLORIDE 200 MG/10ML IV SOSY
PREFILLED_SYRINGE | INTRAVENOUS | Status: AC
  Filled 2018-05-17: qty 10

## 2018-05-17 MED ORDER — PROPOFOL 10 MG/ML IV BOLUS
INTRAVENOUS | Status: DC | PRN
  Administered 2018-05-17: 120 mg via INTRAVENOUS

## 2018-05-17 MED ORDER — LIDOCAINE 2% (20 MG/ML) 5 ML SYRINGE
INTRAMUSCULAR | Status: AC
  Filled 2018-05-17: qty 5

## 2018-05-17 MED ORDER — OXYCODONE HCL 5 MG/5ML PO SOLN: 5.0000 mg | Freq: Once | ORAL | Status: DC | PRN

## 2018-05-17 MED ORDER — CEFAZOLIN SODIUM-DEXTROSE 2-4 GM/100ML-% IV SOLN
2.0000 g | Freq: Three times a day (TID) | INTRAVENOUS | Status: AC
  Administered 2018-05-17 – 2018-05-18 (×2): 2 g via INTRAVENOUS
  Filled 2018-05-17 (×2): qty 100

## 2018-05-17 MED ORDER — ONDANSETRON HCL 4 MG/2ML IJ SOLN: 4.0000 mg | Freq: Once | INTRAMUSCULAR | Status: DC | PRN

## 2018-05-17 MED ORDER — HYDRALAZINE HCL 20 MG/ML IJ SOLN: 5.0000 mg | INTRAMUSCULAR | Status: DC | PRN

## 2018-05-17 MED ORDER — 0.9 % SODIUM CHLORIDE (POUR BTL) OPTIME
TOPICAL | Status: DC | PRN
  Administered 2018-05-17: 1000 mL

## 2018-05-17 MED ORDER — METOPROLOL TARTRATE 5 MG/5ML IV SOLN: 2.0000 mg | INTRAVENOUS | Status: DC | PRN

## 2018-05-17 MED ORDER — ALUM & MAG HYDROXIDE-SIMETH 200-200-20 MG/5ML PO SUSP: 15.0000 mL | ORAL | Status: DC | PRN

## 2018-05-17 MED ORDER — SODIUM CHLORIDE 0.9 % IV SOLN: 500.0000 mL | Freq: Once | INTRAVENOUS | Status: DC | PRN

## 2018-05-17 MED ORDER — DOCUSATE SODIUM 100 MG PO CAPS
100.0000 mg | ORAL_CAPSULE | Freq: Every day | ORAL | Status: DC
  Administered 2018-05-18: 100 mg via ORAL
  Filled 2018-05-17: qty 1

## 2018-05-17 MED ORDER — 0.9 % SODIUM CHLORIDE (POUR BTL) OPTIME
TOPICAL | Status: DC | PRN
  Administered 2018-05-17: 2000 mL

## 2018-05-17 MED ORDER — LIDOCAINE 2% (20 MG/ML) 5 ML SYRINGE
INTRAMUSCULAR | Status: DC | PRN
  Administered 2018-05-17: 40 mg via INTRAVENOUS

## 2018-05-17 MED ORDER — HYDROCHLOROTHIAZIDE 25 MG PO TABS
12.5000 mg | ORAL_TABLET | Freq: Every day | ORAL | Status: DC
  Administered 2018-05-18: 12.5 mg via ORAL
  Filled 2018-05-17: qty 1

## 2018-05-17 MED ORDER — ROCURONIUM BROMIDE 50 MG/5ML IV SOSY
PREFILLED_SYRINGE | INTRAVENOUS | Status: AC
  Filled 2018-05-17: qty 5

## 2018-05-17 MED ORDER — MORPHINE SULFATE (PF) 2 MG/ML IV SOLN: 0.5000 mg | INTRAVENOUS | Status: DC | PRN

## 2018-05-17 MED ORDER — ONDANSETRON HCL 4 MG/2ML IJ SOLN: 4.0000 mg | Freq: Four times a day (QID) | INTRAMUSCULAR | Status: DC | PRN

## 2018-05-17 MED ORDER — PROPOFOL 10 MG/ML IV BOLUS
INTRAVENOUS | Status: AC
  Filled 2018-05-17: qty 20

## 2018-05-17 MED ORDER — FENTANYL CITRATE (PF) 250 MCG/5ML IJ SOLN
INTRAMUSCULAR | Status: DC | PRN
  Administered 2018-05-17: 100 ug via INTRAVENOUS
  Administered 2018-05-17: 50 ug via INTRAVENOUS

## 2018-05-17 MED ORDER — SODIUM CHLORIDE 0.9 % IV SOLN
INTRAVENOUS | Status: AC
  Filled 2018-05-17: qty 1.2

## 2018-05-17 MED ORDER — POTASSIUM CHLORIDE CRYS ER 20 MEQ PO TBCR: 20.0000 meq | EXTENDED_RELEASE_TABLET | Freq: Every day | ORAL | Status: DC | PRN

## 2018-05-17 MED ORDER — GUAIFENESIN-DM 100-10 MG/5ML PO SYRP: 15.0000 mL | ORAL_SOLUTION | ORAL | Status: DC | PRN

## 2018-05-17 MED ORDER — METOPROLOL TARTRATE 25 MG PO TABS
25.0000 mg | ORAL_TABLET | Freq: Two times a day (BID) | ORAL | Status: DC
  Administered 2018-05-17 – 2018-05-18 (×2): 25 mg via ORAL
  Filled 2018-05-17 (×2): qty 1

## 2018-05-17 MED ORDER — EZETIMIBE 10 MG PO TABS
10.0000 mg | ORAL_TABLET | Freq: Every day | ORAL | Status: DC
  Administered 2018-05-18: 10 mg via ORAL
  Filled 2018-05-17: qty 1

## 2018-05-17 MED ORDER — HYDROMORPHONE HCL 1 MG/ML IJ SOLN: 0.2500 mg | INTRAMUSCULAR | Status: DC | PRN

## 2018-05-17 MED ORDER — PROTAMINE SULFATE 10 MG/ML IV SOLN
INTRAVENOUS | Status: AC
  Filled 2018-05-17: qty 5

## 2018-05-17 MED ORDER — ASPIRIN EC 81 MG PO TBEC
81.0000 mg | DELAYED_RELEASE_TABLET | Freq: Every day | ORAL | Status: DC
  Administered 2018-05-17 – 2018-05-18 (×2): 81 mg via ORAL
  Filled 2018-05-17 (×2): qty 1

## 2018-05-17 MED ORDER — HEMOSTATIC AGENTS (NO CHARGE) OPTIME
TOPICAL | Status: DC | PRN
  Administered 2018-05-17: 1 via TOPICAL

## 2018-05-17 MED ORDER — LACTATED RINGERS IV SOLN
INTRAVENOUS | Status: DC
  Administered 2018-05-17 (×2): via INTRAVENOUS

## 2018-05-17 MED ORDER — FENOFIBRATE 160 MG PO TABS
160.0000 mg | ORAL_TABLET | Freq: Every day | ORAL | Status: DC
  Administered 2018-05-18: 160 mg via ORAL
  Filled 2018-05-17: qty 1

## 2018-05-17 MED ORDER — LACTATED RINGERS IV SOLN
INTRAVENOUS | Status: DC | PRN
  Administered 2018-05-17: 09:00:00 via INTRAVENOUS

## 2018-05-17 MED ORDER — ENOXAPARIN SODIUM 30 MG/0.3ML ~~LOC~~ SOLN
30.0000 mg | SUBCUTANEOUS | Status: DC
  Administered 2018-05-18: 30 mg via SUBCUTANEOUS
  Filled 2018-05-17: qty 0.3

## 2018-05-17 MED ORDER — SODIUM CHLORIDE 0.9 % IV SOLN
INTRAVENOUS | Status: DC | PRN
  Administered 2018-05-17: 500 mL

## 2018-05-17 MED ORDER — CHLORHEXIDINE GLUCONATE 4 % EX LIQD: 60.0000 mL | Freq: Once | CUTANEOUS | Status: DC

## 2018-05-17 MED ORDER — PROMETHAZINE HCL 25 MG/ML IJ SOLN: 6.2500 mg | INTRAMUSCULAR | Status: DC | PRN

## 2018-05-17 MED ORDER — CEFAZOLIN SODIUM-DEXTROSE 2-4 GM/100ML-% IV SOLN
INTRAVENOUS | Status: AC
  Filled 2018-05-17: qty 100

## 2018-05-17 MED ORDER — HEPARIN SODIUM (PORCINE) 1000 UNIT/ML IJ SOLN
INTRAMUSCULAR | Status: AC
  Filled 2018-05-17: qty 1

## 2018-05-17 MED ORDER — ALBUMIN HUMAN 5 % IV SOLN
INTRAVENOUS | Status: DC | PRN
  Administered 2018-05-17: 15:00:00 via INTRAVENOUS

## 2018-05-17 MED ORDER — CLOPIDOGREL BISULFATE 75 MG PO TABS
75.0000 mg | ORAL_TABLET | Freq: Every day | ORAL | Status: DC
  Administered 2018-05-18: 75 mg via ORAL
  Filled 2018-05-17: qty 1

## 2018-05-17 MED ORDER — SODIUM CHLORIDE 0.9 % IV SOLN
INTRAVENOUS | Status: DC | PRN
  Administered 2018-05-17: 40 ug/min via INTRAVENOUS

## 2018-05-17 MED ORDER — CEFAZOLIN SODIUM-DEXTROSE 2-3 GM-%(50ML) IV SOLR
INTRAVENOUS | Status: DC | PRN
  Administered 2018-05-17: 2 g via INTRAVENOUS

## 2018-05-17 MED ORDER — SUGAMMADEX SODIUM 200 MG/2ML IV SOLN
INTRAVENOUS | Status: DC | PRN
  Administered 2018-05-17: 200 mg via INTRAVENOUS

## 2018-05-17 MED ORDER — FENTANYL CITRATE (PF) 250 MCG/5ML IJ SOLN
INTRAMUSCULAR | Status: DC | PRN
  Administered 2018-05-17: 50 ug via INTRAVENOUS

## 2018-05-17 MED ORDER — MAGNESIUM SULFATE 2 GM/50ML IV SOLN: 2.0000 g | Freq: Every day | INTRAVENOUS | Status: DC | PRN

## 2018-05-17 MED ORDER — IRBESARTAN 150 MG PO TABS
150.0000 mg | ORAL_TABLET | Freq: Every day | ORAL | Status: DC
  Administered 2018-05-18: 150 mg via ORAL
  Filled 2018-05-17: qty 1

## 2018-05-17 SURGICAL SUPPLY — 51 items
ADH SKN CLS APL DERMABOND .7 (GAUZE/BANDAGES/DRESSINGS) ×1
BIOPATCH RED 1 DISK 7.0 (GAUZE/BANDAGES/DRESSINGS) ×1 IMPLANT
BIOPATCH RED 1IN DISK 7.0MM (GAUZE/BANDAGES/DRESSINGS) ×1
CANISTER SUCT 3000ML PPV (MISCELLANEOUS) ×3 IMPLANT
CATH ROBINSON RED A/P 18FR (CATHETERS) ×3 IMPLANT
CATH SUCT 10FR WHISTLE TIP (CATHETERS) ×3 IMPLANT
CLIP VESOCCLUDE MED 6/CT (CLIP) ×3 IMPLANT
CLIP VESOCCLUDE SM WIDE 6/CT (CLIP) ×3 IMPLANT
COVER PROBE W GEL 5X96 (DRAPES) IMPLANT
COVER WAND RF STERILE (DRAPES) ×3 IMPLANT
CRADLE DONUT ADULT HEAD (MISCELLANEOUS) ×3 IMPLANT
DERMABOND ADVANCED (GAUZE/BANDAGES/DRESSINGS) ×2
DERMABOND ADVANCED .7 DNX12 (GAUZE/BANDAGES/DRESSINGS) ×1 IMPLANT
DRAIN CHANNEL 15F RND FF W/TCR (WOUND CARE) IMPLANT
ELECT REM PT RETURN 9FT ADLT (ELECTROSURGICAL) ×3
ELECTRODE REM PT RTRN 9FT ADLT (ELECTROSURGICAL) ×1 IMPLANT
EVACUATOR SILICONE 100CC (DRAIN) ×2 IMPLANT
GAUZE SPONGE 2X2 8PLY STRL LF (GAUZE/BANDAGES/DRESSINGS) IMPLANT
GLOVE BIOGEL PI IND STRL 7.5 (GLOVE) ×1 IMPLANT
GLOVE BIOGEL PI INDICATOR 7.5 (GLOVE) ×4
GLOVE SURG SS PI 6.5 STRL IVOR (GLOVE) ×2 IMPLANT
GLOVE SURG SS PI 7.5 STRL IVOR (GLOVE) ×3 IMPLANT
GOWN STRL REUS W/ TWL LRG LVL3 (GOWN DISPOSABLE) ×2 IMPLANT
GOWN STRL REUS W/ TWL XL LVL3 (GOWN DISPOSABLE) ×1 IMPLANT
GOWN STRL REUS W/TWL LRG LVL3 (GOWN DISPOSABLE) ×6
GOWN STRL REUS W/TWL XL LVL3 (GOWN DISPOSABLE) ×3
HEMOSTAT SNOW SURGICEL 2X4 (HEMOSTASIS) IMPLANT
INSERT FOGARTY SM (MISCELLANEOUS) IMPLANT
KIT BASIN OR (CUSTOM PROCEDURE TRAY) ×3 IMPLANT
KIT SHUNT ARGYLE CAROTID ART 6 (VASCULAR PRODUCTS) IMPLANT
KIT TURNOVER KIT B (KITS) ×3 IMPLANT
NDL HYPO 25GX1X1/2 BEV (NEEDLE) IMPLANT
NEEDLE HYPO 25GX1X1/2 BEV (NEEDLE) IMPLANT
NS IRRIG 1000ML POUR BTL (IV SOLUTION) ×9 IMPLANT
PACK CAROTID (CUSTOM PROCEDURE TRAY) ×3 IMPLANT
PAD ARMBOARD 7.5X6 YLW CONV (MISCELLANEOUS) ×6 IMPLANT
SHUNT CAROTID BYPASS 10 (VASCULAR PRODUCTS) IMPLANT
SHUNT CAROTID BYPASS 12FRX15.5 (VASCULAR PRODUCTS) IMPLANT
SPONGE GAUZE 2X2 STER 10/PKG (GAUZE/BANDAGES/DRESSINGS) ×2
SUT ETHILON 3 0 PS 1 (SUTURE) IMPLANT
SUT PROLENE 6 0 BV (SUTURE) ×3 IMPLANT
SUT PROLENE 7 0 BV 1 (SUTURE) IMPLANT
SUT SILK 2 0 SH (SUTURE) ×2 IMPLANT
SUT SILK 3 0 (SUTURE)
SUT SILK 3-0 18XBRD TIE 12 (SUTURE) IMPLANT
SUT VIC AB 3-0 SH 27 (SUTURE) ×9
SUT VIC AB 3-0 SH 27X BRD (SUTURE) ×2 IMPLANT
SUT VICRYL 4-0 PS2 18IN ABS (SUTURE) ×3 IMPLANT
SYR CONTROL 10ML LL (SYRINGE) IMPLANT
TOWEL GREEN STERILE (TOWEL DISPOSABLE) ×3 IMPLANT
WATER STERILE IRR 1000ML POUR (IV SOLUTION) ×3 IMPLANT

## 2018-05-17 SURGICAL SUPPLY — 49 items
ADH SKN CLS APL DERMABOND .7 (GAUZE/BANDAGES/DRESSINGS) ×2
CANISTER SUCT 3000ML PPV (MISCELLANEOUS) ×4 IMPLANT
CANNULA VESSEL 3MM 2 BLNT TIP (CANNULA) ×8 IMPLANT
CATH ROBINSON RED A/P 18FR (CATHETERS) ×4 IMPLANT
CLIP LIGATING EXTRA MED SLVR (CLIP) ×4 IMPLANT
CLIP LIGATING EXTRA SM BLUE (MISCELLANEOUS) ×4 IMPLANT
COVER MAYO STAND STRL (DRAPES) ×2 IMPLANT
COVER WAND RF STERILE (DRAPES) ×2 IMPLANT
CRADLE DONUT ADULT HEAD (MISCELLANEOUS) ×4 IMPLANT
DECANTER SPIKE VIAL GLASS SM (MISCELLANEOUS) IMPLANT
DERMABOND ADVANCED (GAUZE/BANDAGES/DRESSINGS) ×2
DERMABOND ADVANCED .7 DNX12 (GAUZE/BANDAGES/DRESSINGS) ×2 IMPLANT
DRAIN HEMOVAC 1/8 X 5 (WOUND CARE) IMPLANT
ELECT REM PT RETURN 9FT ADLT (ELECTROSURGICAL) ×4
ELECTRODE REM PT RTRN 9FT ADLT (ELECTROSURGICAL) ×2 IMPLANT
EVACUATOR SILICONE 100CC (DRAIN) IMPLANT
GLOVE BIOGEL M 6.5 STRL (GLOVE) ×2 IMPLANT
GLOVE BIOGEL M STRL SZ7.5 (GLOVE) ×2 IMPLANT
GLOVE BIOGEL PI IND STRL 6.5 (GLOVE) IMPLANT
GLOVE BIOGEL PI IND STRL 7.5 (GLOVE) IMPLANT
GLOVE BIOGEL PI IND STRL 8 (GLOVE) IMPLANT
GLOVE BIOGEL PI INDICATOR 6.5 (GLOVE) ×2
GLOVE BIOGEL PI INDICATOR 7.5 (GLOVE) ×6
GLOVE BIOGEL PI INDICATOR 8 (GLOVE) ×2
GLOVE ECLIPSE 7.0 STRL STRAW (GLOVE) ×4 IMPLANT
GLOVE SS BIOGEL STRL SZ 7.5 (GLOVE) ×2 IMPLANT
GLOVE SUPERSENSE BIOGEL SZ 7.5 (GLOVE) ×2
GOWN STRL REUS W/ TWL LRG LVL3 (GOWN DISPOSABLE) ×6 IMPLANT
GOWN STRL REUS W/TWL LRG LVL3 (GOWN DISPOSABLE) ×20
KIT BASIN OR (CUSTOM PROCEDURE TRAY) ×4 IMPLANT
KIT SHUNT ARGYLE CAROTID ART 6 (VASCULAR PRODUCTS) IMPLANT
KIT TURNOVER KIT B (KITS) ×4 IMPLANT
NEEDLE 22X1 1/2 (OR ONLY) (NEEDLE) IMPLANT
NS IRRIG 1000ML POUR BTL (IV SOLUTION) ×8 IMPLANT
PACK CAROTID (CUSTOM PROCEDURE TRAY) ×4 IMPLANT
PAD ARMBOARD 7.5X6 YLW CONV (MISCELLANEOUS) ×8 IMPLANT
PATCH HEMASHIELD 8X75 (Vascular Products) ×2 IMPLANT
SHUNT CAROTID BYPASS 10 (VASCULAR PRODUCTS) ×2 IMPLANT
SHUNT CAROTID BYPASS 12FRX15.5 (VASCULAR PRODUCTS) IMPLANT
SUT ETHILON 3 0 PS 1 (SUTURE) IMPLANT
SUT PROLENE 6 0 CC (SUTURE) ×8 IMPLANT
SUT SILK 3 0 (SUTURE)
SUT SILK 3-0 18XBRD TIE 12 (SUTURE) IMPLANT
SUT VIC AB 3-0 SH 27 (SUTURE) ×8
SUT VIC AB 3-0 SH 27X BRD (SUTURE) ×4 IMPLANT
SUT VICRYL 4-0 PS2 18IN ABS (SUTURE) ×4 IMPLANT
SYR CONTROL 10ML LL (SYRINGE) ×2 IMPLANT
TOWEL GREEN STERILE (TOWEL DISPOSABLE) ×4 IMPLANT
WATER STERILE IRR 1000ML POUR (IV SOLUTION) ×4 IMPLANT

## 2018-05-17 NOTE — Progress Notes (Signed)
Family has returned to hospital/dr brabham has spoken with them 9 sons+ wife) re: procedure for hematoma. Acknowledged understanding of his explanation.

## 2018-05-17 NOTE — Progress Notes (Signed)
Dr 's Early and Brabham notified of nes onset hematoma l neck over incision. Dr Early scrubbed in , Brabham staes he will eavaluate. Brandon Roy a[pplied. Pt instructed to remain still/calm.

## 2018-05-17 NOTE — Progress Notes (Signed)
S/p L CEA earlier today by Dr. Donnetta Hutching.  He acutely developed a left nick hematoma prior to transfer to the floor.  He is neurologically intact.  I have recommended going to the OR for evacuation.  Tried to contact wife.  She has gone to lunch and did not answer the phone.  Annamarie Major

## 2018-05-17 NOTE — Transfer of Care (Signed)
Immediate Anesthesia Transfer of Care Note  Patient: Brandon Roy  Procedure(s) Performed: LEFT ENDARTERECTOMY CAROTID (Left ) PATCH ANGIOPLASTY USING HEMASHIELD PLATINUM FINESSE (Left Neck)  Patient Location: PACU  Anesthesia Type:General  Level of Consciousness: awake, alert  and oriented  Airway & Oxygen Therapy: Patient Spontanous Breathing and Patient connected to nasal cannula oxygen  Post-op Assessment: Report given to RN, Post -op Vital signs reviewed and stable, Patient moving all extremities X 4 and Patient able to stick tongue midline  Post vital signs: Reviewed and stable  Last Vitals:  Vitals Value Taken Time  BP 104/63 05/17/2018 12:41 PM  Temp    Pulse 101 05/17/2018 12:44 PM  Resp 27 05/17/2018 12:44 PM  SpO2 93 % 05/17/2018 12:44 PM  Vitals shown include unvalidated device data.  Last Pain:  Vitals:   05/17/18 0753  TempSrc: Oral  PainSc:       Patients Stated Pain Goal: 3 (94/58/59 2924)  Complications: No apparent anesthesia complications

## 2018-05-17 NOTE — Anesthesia Procedure Notes (Signed)
Arterial Line Insertion Start/End10/18/2019 9:20 AM, 05/17/2018 9:25 AM Performed by: Imagene Riches, CRNA, CRNA  Preanesthetic checklist: patient identified, IV checked, site marked, risks and benefits discussed, surgical consent, monitors and equipment checked, pre-op evaluation and anesthesia consent Right, radial was placed Catheter size: 20 G Hand hygiene performed  and maximum sterile barriers used   Attempts: 2 Procedure performed without using ultrasound guided technique. Following insertion, Biopatch and dressing applied. Post procedure assessment: normal  Patient tolerated the procedure well with no immediate complications.

## 2018-05-17 NOTE — OR Nursing (Signed)
Neuro exam performed prior to surgery.  Neuro exam WNL.  No weakness noted in any extremity.

## 2018-05-17 NOTE — Anesthesia Procedure Notes (Signed)
Procedure Name: Intubation Date/Time: 05/17/2018 2:25 PM Performed by: Myna Bright, CRNA Pre-anesthesia Checklist: Patient identified, Emergency Drugs available, Suction available and Patient being monitored Patient Re-evaluated:Patient Re-evaluated prior to induction Oxygen Delivery Method: Circle system utilized Preoxygenation: Pre-oxygenation with 100% oxygen Induction Type: IV induction Ventilation: Mask ventilation without difficulty Laryngoscope Size: Glidescope and 4 Tube type: Subglottic suction tube Tube size: 7.5 mm Number of attempts: 1 Airway Equipment and Method: Stylet and Video-laryngoscopy Placement Confirmation: ETT inserted through vocal cords under direct vision,  positive ETCO2 and breath sounds checked- equal and bilateral Secured at: 22 cm Tube secured with: Tape Dental Injury: Teeth and Oropharynx as per pre-operative assessment  Comments: Glidescope used d/t carotid hematoma. Good view with Glide, atraumatic intubation.

## 2018-05-17 NOTE — Anesthesia Preprocedure Evaluation (Signed)
Anesthesia Evaluation  Patient identified by MRN, date of birth, ID band Patient awake    Reviewed: Allergy & Precautions, NPO status , Patient's Chart, lab work & pertinent test results  Airway Mallampati: II  TM Distance: >3 FB Neck ROM: Full    Dental no notable dental hx.    Pulmonary neg pulmonary ROS, former smoker,    Pulmonary exam normal breath sounds clear to auscultation       Cardiovascular hypertension, Pt. on medications + CAD  Normal cardiovascular exam Rhythm:Regular Rate:Normal     Neuro/Psych negative neurological ROS  negative psych ROS   GI/Hepatic negative GI ROS, Neg liver ROS,   Endo/Other  negative endocrine ROS  Renal/GU negative Renal ROS  negative genitourinary   Musculoskeletal  (+) Arthritis , Osteoarthritis,    Abdominal   Peds negative pediatric ROS (+)  Hematology negative hematology ROS (+)   Anesthesia Other Findings   Reproductive/Obstetrics negative OB ROS                             Anesthesia Physical Anesthesia Plan  ASA: III  Anesthesia Plan: General   Post-op Pain Management:    Induction: Intravenous  PONV Risk Score and Plan: 2 and Ondansetron and Midazolam  Airway Management Planned: Oral ETT  Additional Equipment: Arterial line  Intra-op Plan:   Post-operative Plan: Extubation in OR  Informed Consent: I have reviewed the patients History and Physical, chart, labs and discussed the procedure including the risks, benefits and alternatives for the proposed anesthesia with the patient or authorized representative who has indicated his/her understanding and acceptance.   Dental advisory given  Plan Discussed with: CRNA  Anesthesia Plan Comments:         Anesthesia Quick Evaluation

## 2018-05-17 NOTE — Transfer of Care (Signed)
Immediate Anesthesia Transfer of Care Note  Patient: Brandon Roy  Procedure(s) Performed: EXPLORATION OF NECK (Left )  Patient Location: PACU  Anesthesia Type:General  Level of Consciousness: awake, alert , oriented and patient cooperative  Airway & Oxygen Therapy: Patient Spontanous Breathing and Patient connected to nasal cannula oxygen  Post-op Assessment: Report given to RN, Post -op Vital signs reviewed and stable and Patient moving all extremities  Post vital signs: Reviewed and stable  Last Vitals:  Vitals Value Taken Time  BP 80/48 05/17/2018  3:16 PM  Temp    Pulse 82 05/17/2018  3:18 PM  Resp 14 05/17/2018  3:18 PM  SpO2 94 % 05/17/2018  3:18 PM  Vitals shown include unvalidated device data.  Last Pain:  Vitals:   05/17/18 1300  TempSrc:   PainSc: 0-No pain      Patients Stated Pain Goal: 3 (25/63/89 3734)  Complications: No apparent anesthesia complications

## 2018-05-17 NOTE — Anesthesia Postprocedure Evaluation (Signed)
Anesthesia Post Note  Patient: ALEXANDRO LINE  Procedure(s) Performed: LEFT ENDARTERECTOMY CAROTID (Left ) PATCH ANGIOPLASTY USING HEMASHIELD PLATINUM FINESSE (Left Neck)     Patient location during evaluation: PACU Anesthesia Type: General Level of consciousness: awake and alert Pain management: pain level controlled Vital Signs Assessment: post-procedure vital signs reviewed and stable Respiratory status: spontaneous breathing, nonlabored ventilation and respiratory function stable Cardiovascular status: blood pressure returned to baseline and stable Postop Assessment: no apparent nausea or vomiting Anesthetic complications: no    Last Vitals:  Vitals:   05/17/18 1300 05/17/18 1335  BP:  (!) 82/63  Pulse: 93 89  Resp: (!) 30 14  Temp:    SpO2: 96% 95%    Last Pain:  Vitals:   05/17/18 1300  TempSrc:   PainSc: 0-No pain                 Lynda Rainwater

## 2018-05-17 NOTE — Anesthesia Procedure Notes (Signed)
Procedure Name: Intubation Date/Time: 05/17/2018 9:58 AM Performed by: Imagene Riches, CRNA Pre-anesthesia Checklist: Patient identified, Emergency Drugs available, Suction available and Patient being monitored Patient Re-evaluated:Patient Re-evaluated prior to induction Oxygen Delivery Method: Circle System Utilized Preoxygenation: Pre-oxygenation with 100% oxygen Induction Type: IV induction Ventilation: Mask ventilation without difficulty Laryngoscope Size: Miller Grade View: Grade I Tube type: Oral Tube size: 7.5 mm Number of attempts: 1 Airway Equipment and Method: Stylet and Oral airway Placement Confirmation: ETT inserted through vocal cords under direct vision,  positive ETCO2 and breath sounds checked- equal and bilateral Secured at: 22 cm Tube secured with: Tape Dental Injury: Teeth and Oropharynx as per pre-operative assessment

## 2018-05-17 NOTE — Interval H&P Note (Signed)
History and Physical Interval Note:  05/17/2018 7:43 AM  Brandon Roy  has presented today for surgery, with the diagnosis of I65.22  The various methods of treatment have been discussed with the patient and family. After consideration of risks, benefits and other options for treatment, the patient has consented to  Procedure(s): LEFT ENDARTERECTOMY CAROTID (Left) as a surgical intervention .  The patient's history has been reviewed, patient examined, no change in status, stable for surgery.  I have reviewed the patient's chart and labs.  Questions were answered to the patient's satisfaction.     Curt Jews

## 2018-05-17 NOTE — Op Note (Addendum)
    Patient name: Brandon Roy MRN: [id number] DOB: 05/16/1934 Sex: male  05/17/2018 Pre-operative Diagnosis: Left neck exploration following left carotid endarterectomy Post-operative diagnosis:  Same Surgeon:  Annamarie Major Assistants:  Izetta Dakin Procedure:   Reexploration of left neck, status post left carotid endarterectomy, evacuation of hematoma Anesthesia: General Blood Loss: 200 cc of old blood was evacuated Specimens: None  Findings: Large hematoma, no active bleeding  Indications: Earlier today, the patient underwent a left carotid endarterectomy for asymptomatic stenosis by Dr. Donnetta Hutching.  He is on dual antiplatelet therapy.  He did well initially, however while in the PACU, he developed acute swelling in his left neck.  I felt that he needed to go back to the operating room for exploration and evacuation of his hematoma.  Procedure:  The patient was identified in the holding area and taken to [place] [facility]  The patient was then placed supine on the table. general anesthesia was administered.  The patient was prepped and draped in the usual sterile fashion.  A time out was called and antibiotics were administered.  The patient's previous left carotid incision was opened with a 10 blade.  The sutures were removed.  A large hematoma was found underneath the carotid sheath closure.  Approximately 200 cc of clotted blood was evacuated.  There was no obvious bleeding source.  There was oozing from raw surface areas which were cauterized.  I then placed Surgicel powder within the wound and held pressure for approximately 10 minutes.  I then irrigated the wound again and cauterized anything that look like it could have bled.  The suture line from the patch endarterectomy was without bleeding.  I elected to place a 15 Blake drain which was brought out through a separate stab incision and sutured into position with 2-0 silk.  The wound was then irrigated again.  I reclosed the carotid sheath with  3-0 Vicryl.  The platysma muscle was closed with 3-0 Vicryl, and the skin was closed with 4-0 Vicryl and Dermabond.  There were no immediate complications.   Disposition: To PACU stable   V. Annamarie Major, M.D. [facility] Office: [phone] Pager:  [phone]

## 2018-05-17 NOTE — Anesthesia Preprocedure Evaluation (Signed)
Anesthesia Evaluation  Patient identified by MRN, date of birth, ID band Patient awake    Reviewed: Allergy & Precautions, NPO status , Patient's Chart, lab work & pertinent test results  Airway Mallampati: II  TM Distance: >3 FB Neck ROM: Full   Comment: Hematoma, left neck. No tracheal shift, no stridor or voice changes. No acute distress. Dental  (+) Partial Lower, Partial Upper   Pulmonary former smoker,    breath sounds clear to auscultation       Cardiovascular hypertension,  Rhythm:Regular Rate:Normal     Neuro/Psych    GI/Hepatic   Endo/Other    Renal/GU      Musculoskeletal   Abdominal   Peds  Hematology   Anesthesia Other Findings   Reproductive/Obstetrics                             Anesthesia Physical Anesthesia Plan  ASA: IV and emergent  Anesthesia Plan: General   Post-op Pain Management:    Induction:   PONV Risk Score and Plan: Ondansetron and Dexamethasone  Airway Management Planned: Oral ETT and Video Laryngoscope Planned  Additional Equipment:   Intra-op Plan:   Post-operative Plan: Extubation in OR  Informed Consent: I have reviewed the patients History and Physical, chart, labs and discussed the procedure including the risks, benefits and alternatives for the proposed anesthesia with the patient or authorized representative who has indicated his/her understanding and acceptance.     Plan Discussed with: Anesthesiologist and CRNA  Anesthesia Plan Comments:         Anesthesia Quick Evaluation

## 2018-05-17 NOTE — Op Note (Signed)
    OPERATIVE REPORT  DATE OF SURGERY: 05/17/2018  PATIENT: Brandon Roy, 84 y.o. male MRN: [id number]  DOB: 27-Mar-1934  PRE-OPERATIVE DIAGNOSIS: Asymptomatic left internal carotid artery stenosis  POST-OPERATIVE DIAGNOSIS:  Same  PROCEDURE: Left carotid endarterectomy and Dacron patch angioplasty  SURGEON:  Curt Jews, M.D.  PHYSICIAN ASSISTANT: Dr.Chris Scot Dock and Laurence Slate, PA-C  ANESTHESIA: General  EBL: per anesthesia record  Total I/O In: 2850 [I.V.:2600; IV Piggyback:250] Out: 500 [Urine:400; Blood:100]  BLOOD ADMINISTERED: none  DRAINS: none  SPECIMEN: none  COUNTS CORRECT:  YES  PATIENT DISPOSITION:  PACU - hemodynamically stable  PROCEDURE DETAILS: Patient was taken to heparin placed supine position where the area of the left next prepped draped you sterile fashion.  Incision was made anterior sternocleidomastoid and carried down through the platysma letter cautery.  Sternocleidomastoid reflected posteriorly and the carotid sheath was opened.  Facial vein was ligated with 2-0 silk ties and divided.  The common carotid artery was encircled with an umbilical tape and Rummel tourniquet.  The external carotid was encircled with a blue vessel loop.  Superior thyroid artery was encircled with a 2-0 silk Potts tie.  The internal carotid was encircled with an umbilical tape and Rummel tourniquet.  Patient was given 8000 units of intravenous heparin after extrication time the internal/external common carotid arteries were occluded.  Common carotid artery was opened with 11 blade extended with Potts scissors through the plaque onto the internal carotid artery.  A 10 shunt was passed up the internal carotid and allowed to backbleed and down the common carotid where secured with Rummel tourniquet's.  The endarterectomy was again on the common carotid artery and the plaque was divided proximally with Potts scissors.  The endarterectomy can only bifurcation.  The external carotid  was in the right neck mass with eversion technique and the internal carotid was under Bactrim as an open fashion.  Remaining atheromatous debris was removed from the endarterectomy plane.  A Finesse Hemashield patch was brought to the field and was sewn as a patch angioplasty with a running 6-0 Prolene suture.  Prior to completion of the closure the shunt was removed in the usual flushing maneuvers were undertaken.  Anastomosis completed and flow was restored.  There was good Doppler flow in the internal carotid artery.  The external carotid appear to be somewhat occlusive.  There was a long eversion plaque.  For this reason the external carotid was occluded at its origin and then occluded distally and was opened.  There was a flap in the external carotid and this was removed.  The arteriotomy was closed with a 6-0 Prolene suture.  Clamps removed and good flow was noted.  The patient was given 50 mg of protamine to reverse heparin.  Wounds irrigated with saline.  Hemostasis talus cautery.  Patient was on aspirin and Plavix and there was more than usual amount of needle hole oozing from the Dacron patch.  With time this stopped.  The wound was again irrigated and hemostasis was obtained electrocautery.  The wounds were closed with several 3-0 Vicryl sutures to reapproximate sternocleidomastoid over the carotid sheath.  The platysma was closed with a running 3-0 Vicryl suture and finally the skin was closed with 4 sub-particular Vicryl stitch.  The patient was awakened neurologically intact in the operating room and was transferred to the recovery room in stable condition   Rosetta Posner, M.D., [facility] 05/17/2018 3:53 PM

## 2018-05-17 NOTE — Anesthesia Postprocedure Evaluation (Signed)
Anesthesia Post Note  Patient: Brandon Roy  Procedure(s) Performed: EXPLORATION OF NECK (Left )     Patient location during evaluation: PACU Anesthesia Type: General Level of consciousness: awake and alert Pain management: pain level controlled Vital Signs Assessment: post-procedure vital signs reviewed and stable Respiratory status: spontaneous breathing, nonlabored ventilation, respiratory function stable and patient connected to nasal cannula oxygen Cardiovascular status: blood pressure returned to baseline and stable Postop Assessment: no apparent nausea or vomiting Anesthetic complications: no    Last Vitals:  Vitals:   05/17/18 1628 05/17/18 1946  BP: 104/61 94/62  Pulse: 91 94  Resp: 12 13  Temp: 36.6 C 36.9 C  SpO2: 95% 96%    Last Pain:  Vitals:   05/17/18 1946  TempSrc: Oral  PainSc: 0-No pain                 Yulia Ulrich,Cong COKER

## 2018-05-18 ENCOUNTER — Encounter (HOSPITAL_COMMUNITY): Payer: Self-pay | Admitting: Surgery

## 2018-05-18 LAB — CBC
HCT: 31.2 % — ABNORMAL LOW (ref 39.0–52.0)
HEMOGLOBIN: 10.5 g/dL — AB (ref 13.0–17.0)
MCH: 30.3 pg (ref 26.0–34.0)
MCHC: 33.7 g/dL (ref 30.0–36.0)
MCV: 89.9 fL (ref 80.0–100.0)
PLATELETS: 315 10*3/uL (ref 150–400)
RBC: 3.47 MIL/uL — ABNORMAL LOW (ref 4.22–5.81)
RDW: 12.5 % (ref 11.5–15.5)
WBC: 11.6 10*3/uL — AB (ref 4.0–10.5)
nRBC: 0 % (ref 0.0–0.2)

## 2018-05-18 LAB — BASIC METABOLIC PANEL
Anion gap: 8 (ref 5–15)
BUN: 20 mg/dL (ref 8–23)
CALCIUM: 8.4 mg/dL — AB (ref 8.9–10.3)
CHLORIDE: 106 mmol/L (ref 98–111)
CO2: 22 mmol/L (ref 22–32)
CREATININE: 0.94 mg/dL (ref 0.61–1.24)
GFR calc non Af Amer: >60 mL/min (ref >60)
Glucose, Bld: 116 mg/dL — ABNORMAL HIGH (ref 70–99)
Potassium: 3.5 mmol/L (ref 3.5–5.1)
SODIUM: 136 mmol/L (ref 135–145)

## 2018-05-18 MED ORDER — OXYCODONE-ACETAMINOPHEN 5-325 MG PO TABS: 1.0000 | ORAL_TABLET | Freq: Four times a day (QID) | ORAL | 0 refills | Status: DC | PRN

## 2018-05-18 NOTE — Progress Notes (Signed)
IV and telemetry discontinued. CCMD notified. Discharge instructions reviewed with patient and patient's wife. Printed Rx for pain medication given to patient.  All questions answered.   Emelda Fear, RN

## 2018-05-18 NOTE — Discharge Instructions (Signed)
   [facility]  Discharge Instructions   Carotid Endarterectomy (CEA)  Please refer to the following instructions for your post-procedure care. Your surgeon or physician assistant will discuss any changes with you.  Activity  You are encouraged to walk as much as you can. You can slowly return to normal activities but must avoid strenuous activity and heavy lifting until your doctor tell you it's OK. Avoid activities such as vacuuming or swinging a golf club. You can drive after one week if you are comfortable and you are no longer taking prescription pain medications. It is normal to feel tired for serval weeks after your surgery. It is also normal to have difficulty with sleep habits, eating, and bowel movements after surgery. These will go away with time.  Bathing/Showering  You may shower after you come home. Do not soak in a bathtub, hot tub, or swim until the incision heals completely.  Incision Care  Shower every day. Clean your incision with mild soap and water. Pat the area dry with a clean towel. You do not need a bandage unless otherwise instructed. Do not apply any ointments or creams to your incision. You may have skin glue on your incision. Do not peel it off. It will come off on its own in about one week. Your incision may feel thickened and raised for several weeks after your surgery. This is normal and the skin will soften over time. For Men Only: It's OK to shave around the incision but do not shave the incision itself for 2 weeks. It is common to have numbness under your chin that could last for several months.  Diet  Resume your normal diet. There are no special food restrictions following this procedure. A low fat/low cholesterol diet is recommended for all patients with vascular disease. In order to heal from your surgery, it is CRITICAL to get adequate nutrition. Your body requires vitamins, minerals, and protein. Vegetables are the best  source of vitamins and minerals. Vegetables also provide the perfect balance of protein. Processed food has little nutritional value, so try to avoid this.        Medications  Resume taking all of your medications unless your doctor or physician assistant tells you not to. If your incision is causing pain, you may take over-the- counter pain relievers such as acetaminophen (Tylenol). If you were prescribed a stronger pain medication, please be aware these medications can cause nausea and constipation. Prevent nausea by taking the medication with a snack or meal. Avoid constipation by drinking plenty of fluids and eating foods with a high amount of fiber, such as fruits, vegetables, and grains. Do not take Tylenol if you are taking prescription pain medications.  Follow Up  Our office will schedule a follow up appointment 2-3 weeks following discharge.  Please call us immediately for any of the following conditions  Increased pain, redness, drainage (pus) from your incision site. Fever of 101 degrees or higher. If you should develop stroke (slurred speech, difficulty swallowing, weakness on one side of your body, loss of vision) you should call 911 and go to the nearest emergency room.  Reduce your risk of vascular disease:  Stop smoking. If you would like help call [facility] at 1-800-QUIT-NOW (1-800-784-8669) or [place] at [phone]. Manage your cholesterol Maintain a desired weight Control your diabetes Keep your blood pressure down  If you have any questions, please call the office at [phone].   

## 2018-05-18 NOTE — Progress Notes (Signed)
JP drain removed at this time per MD order. Patient tolerated well.

## 2018-05-18 NOTE — Progress Notes (Addendum)
[facility]  Subjective  - Doing better without new complaints.   Objective (!) 114/55 70 98 F (36.7 C) (Oral) 17 94%  Intake/Output Summary (Last 24 hours) at 05/18/2018 0812 Last data filed at 05/18/2018 0755 Gross per 24 hour  Intake 4315 ml  Output 1380 ml  Net 2935 ml    Left neck incisional fullness without recurrent hematoma JP drain in place 30 cc total output since surgery. Grip 5/5 equal B UE, palpable radial pulse left UE, smile symmetric and no tongue deviation Lungs non labored breathing Heart RRR  Assessment/Planning: POD # 1 left CEA followed by return to OR for hematoma evacuation Right ICA occluded  Pending mobility and brreakfast possible to D/C JP drain and discharge home today.  We will check on him around noon and have Dr. Trula Slade decide on discharge. F/U with Dr. Donnetta Hutching in 2-3 weeks   Roxy Horseman 05/18/2018 8:12 AM --  Laboratory Lab Results: Recent Labs    05/17/18 1924 05/18/18 0458  WBC 14.0* 11.6*  HGB 11.8* 10.5*  HCT 34.9* 31.2*  PLT 333 315   BMET Recent Labs    05/16/18 1150 05/17/18 0806 05/17/18 1433 05/17/18 1924 05/18/18 0458  NA 139 137 137  --  136  K 3.3* 3.3* 3.9  --  3.5  CL 101  --   --   --  106  CO2 26  --   --   --  22  GLUCOSE 110* 98  --   --  116*  BUN 27*  --   --   --  20  CREATININE 1.11  --   --  0.89 0.94  CALCIUM 9.6  --   --   --  8.4*    COAG Lab Results  Component Value Date   INR 1.05 05/16/2018   INR 1.03 08/23/2009   No results found for: PTT  Agree with the above.  Minimal output from drain.  No hematoma.  Neuro intact.  Will ambulate this am.  If he does ok, he can go home.  Will remove JP prior to discharge  Wells BRabham

## 2018-05-18 NOTE — Progress Notes (Signed)
Dr. Trula Slade updated on patient status. New orders received.   Emelda Fear, RN

## 2018-05-18 NOTE — Progress Notes (Addendum)
Patient ambulated about 550 ft. in hallway with front wheel walker assist. RN at side. Patient tolerated well. No s/s of distress noted. Upon completion: BP 121/53, HR 71, O2 97%  JP drain emptied at this time with 8 ml drainage noted.   Emelda Fear, RN

## 2018-05-19 DIAGNOSIS — I10 Essential (primary) hypertension: Secondary | ICD-10-CM | POA: Diagnosis not present

## 2018-05-19 DIAGNOSIS — Z48812 Encounter for surgical aftercare following surgery on the circulatory system: Secondary | ICD-10-CM | POA: Diagnosis not present

## 2018-05-19 DIAGNOSIS — Z87891 Personal history of nicotine dependence: Secondary | ICD-10-CM | POA: Diagnosis not present

## 2018-05-19 DIAGNOSIS — I70203 Unspecified atherosclerosis of native arteries of extremities, bilateral legs: Secondary | ICD-10-CM | POA: Diagnosis not present

## 2018-05-19 DIAGNOSIS — I2510 Atherosclerotic heart disease of native coronary artery without angina pectoris: Secondary | ICD-10-CM | POA: Diagnosis not present

## 2018-05-19 DIAGNOSIS — E785 Hyperlipidemia, unspecified: Secondary | ICD-10-CM | POA: Diagnosis not present

## 2018-05-20 ENCOUNTER — Encounter (HOSPITAL_COMMUNITY): Payer: Self-pay | Admitting: Vascular Surgery

## 2018-05-20 ENCOUNTER — Telehealth: Payer: Self-pay | Admitting: Vascular Surgery

## 2018-05-20 NOTE — Telephone Encounter (Signed)
sch appt spk to pt 06/04/18 3pm p/o MD

## 2018-05-20 NOTE — Telephone Encounter (Signed)
-----   Message from Ulyses Amor, [place] sent at 05/18/2018  8:21 AM EDT -----  S/P left CEA by Dr. Donnetta Hutching f/u with Dr. Donnetta Hutching in 2-3 weeks no study needed.

## 2018-05-20 NOTE — Discharge Summary (Signed)
[facility] Discharge Summary   Patient ID:  Brandon Roy MRN: [id number] DOB/AGE: [birth year] 84 y.o.  Admit date: 05/17/2018 Discharge date: 05/18/2018 Date of Surgery: 05/17/2018 Surgeon: Surgeon(s): Serafina Mitchell, MD  Admission Diagnosis: I65.22  Discharge Diagnoses:  I65.22  Secondary Diagnoses: Past Medical History:  Diagnosis Date  . CAD (coronary artery disease)    Status post recent PTCA and stenting of the proximal LAD (3.61mmx23mm Promus stent, post dilated using a 3.75 [place] sprinter) and the distal LAD  . Carotid artery occlusion   . HTN (hypertension)   . Hyperlipidemia   . Peripheral arterial disease ([place])   . Pneumonia 1941  . Shingles   . Wears dentures   . Wears glasses     Procedure(s): EXPLORATION OF NECK  Discharged Condition: stable  HPI: Brandon Roy is a 84 y.o. male, who is found to have a left carotid bruit by Dr.Nahser on physical exam.  He underwent a carotid duplex which revealed right internal carotid artery occlusion and critical stenosis of his left internal carotid artery.  He is right-handed.  He is totally asymptomatic.  He was scheduled for left CEA.   Hospital Course:  Brandon Roy is a 84 y.o. male is S/P Left Procedure(s): EXPLORATION OF NECK Post op in the PACU he was found to have a left neck hematoma.  Dr. Trula Slade returned him to the OR for left neck exploration.  He found a Large hematoma, no active bleeding.  A JP drain was placed in the wound bed.  Pre-op he was on both aspirin and Plavix.    Post op day 1 the JP out put was SS 30 cc total.  After ambulating and eating breakfast the JP drain was discontinued and he was discharged home in stable condition without recurrent hematoma.   Significant Diagnostic Studies: CBC Lab Results  Component Value Date   WBC 11.6 (H) 05/18/2018   HGB 10.5 (L) 05/18/2018   HCT 31.2 (L) 05/18/2018   MCV 89.9 05/18/2018   PLT 315 05/18/2018    BMET    Component  Value Date/Time   NA 136 05/18/2018 0458   K 3.5 05/18/2018 0458   CL 106 05/18/2018 0458   CO2 22 05/18/2018 0458   GLUCOSE 116 (H) 05/18/2018 0458   BUN 20 05/18/2018 0458   CREATININE 0.94 05/18/2018 0458   CALCIUM 8.4 (L) 05/18/2018 0458   GFRNONAA >60 05/18/2018 0458   GFRAA >60 05/18/2018 0458   COAG Lab Results  Component Value Date   INR 1.05 05/16/2018   INR 1.03 08/23/2009     Disposition:  Discharge to :Home Discharge Instructions    Call MD for:  redness, tenderness, or signs of infection (pain, swelling, bleeding, redness, odor or green/yellow discharge around incision site)   Complete by:  As directed    Call MD for:  severe or increased pain, loss or decreased feeling  in affected limb(s)   Complete by:  As directed    Call MD for:  temperature >100.5   Complete by:  As directed    Resume previous diet   Complete by:  As directed      Allergies as of 05/18/2018      Reactions   Lipitor [atorvastatin Calcium]    Valsartan-hydrochlorothiazide Itching   The generic valsartan - HCTZ caused him to itch   Zocor [simvastatin]       Medication List    TAKE these medications   aspirin 81  MG tablet Take 1 tablet (81 mg total) by mouth daily.   clopidogrel 75 MG tablet Commonly known as:  PLAVIX TAKE 1 TABLET (75 MG TOTAL) BY MOUTH DAILY. What changed:  See the new instructions.   ezetimibe 10 MG tablet Commonly known as:  ZETIA Take 1 tablet (10 mg total) by mouth daily.   fenofibrate 160 MG tablet TAKE 1 TABLET (160 MG TOTAL) BY MOUTH DAILY. What changed:  See the new instructions.   hydrochlorothiazide 12.5 MG tablet Commonly known as:  HYDRODIURIL Take 12.5 mg by mouth daily.   irbesartan 150 MG tablet Commonly known as:  AVAPRO Take 150 mg by mouth daily.   metoprolol tartrate 50 MG tablet Commonly known as:  LOPRESSOR Take 25 mg by mouth 2 (two) times daily.   oxyCODONE-acetaminophen 5-325 MG tablet Commonly known as:   PERCOCET/ROXICET Take 1 tablet by mouth every 6 (six) hours as needed for moderate pain.   rosuvastatin 20 MG tablet Commonly known as:  CRESTOR Take 20 mg by mouth daily.   vitamin B-12 1000 MCG tablet Commonly known as:  CYANOCOBALAMIN Take 1,000 mcg by mouth daily.      Verbal and written Discharge instructions given to the patient. Wound care per Discharge AVS Follow-up Information    Early, Arvilla Meres, MD Follow up in 2 week(s).   Specialties:  Vascular Surgery, Cardiology Why:  office will call Contact information: [place] [phone]           Signed: Roxy Horseman 05/20/2018, 12:54 PM --- For VQI Registry use --- Instructions: Press F2 to tab through selections.  Delete question if not applicable.   Modified Rankin score at D/C (0-6): Rankin Score=0  IV medication needed for:  1. Hypertension: No 2. Hypotension: No  Post-op Complications: No  1. Post-op CVA or TIA: No  If yes: Event classification (right eye, left eye, right cortical, left cortical, verterobasilar, other):   If yes: Timing of event (intra-op, <6 hrs post-op, >=6 hrs post-op, unknown):   2. CN injury: No  If yes: CN  injuried   3. Myocardial infarction: No  If yes: Dx by (EKG or clinical, Troponin):   4.  CHF: No  5.  Dysrhythmia (new): No  6. Wound infection: No  7. Reperfusion symptoms: No  8. Return to OR: No  If yes: return to OR for (bleeding, neurologic, other CEA incision, other):   Discharge medications: Statin use:  Yes ASA use:  Yes Beta blocker use:  Yes ACE-Inhibitor use:  No  for medical reason   P2Y12 Antagonist use: [ ]  None, [x ] Plavix, [ ]  Plasugrel, [ ]  Ticlopinine, [ ]  Ticagrelor, [ ]  Other, [ ]  No for medical reason, [ ]  Non-compliant, [ ]  Not-indicated Anti-coagulant use:  [x ] None, [ ]  Warfarin, [ ]  Rivaroxaban, [ ]  Dabigatran, [ ]  Other, [ ]  No for medical reason, [ ]  Non-compliant, [ ]  Not-indicated

## 2018-05-21 DIAGNOSIS — I10 Essential (primary) hypertension: Secondary | ICD-10-CM | POA: Diagnosis not present

## 2018-05-21 DIAGNOSIS — Z87891 Personal history of nicotine dependence: Secondary | ICD-10-CM | POA: Diagnosis not present

## 2018-05-21 DIAGNOSIS — I2510 Atherosclerotic heart disease of native coronary artery without angina pectoris: Secondary | ICD-10-CM | POA: Diagnosis not present

## 2018-05-21 DIAGNOSIS — Z48812 Encounter for surgical aftercare following surgery on the circulatory system: Secondary | ICD-10-CM | POA: Diagnosis not present

## 2018-05-21 DIAGNOSIS — I70203 Unspecified atherosclerosis of native arteries of extremities, bilateral legs: Secondary | ICD-10-CM | POA: Diagnosis not present

## 2018-05-21 DIAGNOSIS — E785 Hyperlipidemia, unspecified: Secondary | ICD-10-CM | POA: Diagnosis not present

## 2018-05-23 DIAGNOSIS — I10 Essential (primary) hypertension: Secondary | ICD-10-CM | POA: Diagnosis not present

## 2018-05-23 DIAGNOSIS — E785 Hyperlipidemia, unspecified: Secondary | ICD-10-CM | POA: Diagnosis not present

## 2018-05-23 DIAGNOSIS — I70203 Unspecified atherosclerosis of native arteries of extremities, bilateral legs: Secondary | ICD-10-CM | POA: Diagnosis not present

## 2018-05-23 DIAGNOSIS — I2510 Atherosclerotic heart disease of native coronary artery without angina pectoris: Secondary | ICD-10-CM | POA: Diagnosis not present

## 2018-05-23 DIAGNOSIS — Z87891 Personal history of nicotine dependence: Secondary | ICD-10-CM | POA: Diagnosis not present

## 2018-05-23 DIAGNOSIS — Z48812 Encounter for surgical aftercare following surgery on the circulatory system: Secondary | ICD-10-CM | POA: Diagnosis not present

## 2018-05-27 DIAGNOSIS — I70203 Unspecified atherosclerosis of native arteries of extremities, bilateral legs: Secondary | ICD-10-CM | POA: Diagnosis not present

## 2018-05-27 DIAGNOSIS — I10 Essential (primary) hypertension: Secondary | ICD-10-CM | POA: Diagnosis not present

## 2018-05-27 DIAGNOSIS — Z48812 Encounter for surgical aftercare following surgery on the circulatory system: Secondary | ICD-10-CM | POA: Diagnosis not present

## 2018-05-27 DIAGNOSIS — Z87891 Personal history of nicotine dependence: Secondary | ICD-10-CM | POA: Diagnosis not present

## 2018-05-27 DIAGNOSIS — I2510 Atherosclerotic heart disease of native coronary artery without angina pectoris: Secondary | ICD-10-CM | POA: Diagnosis not present

## 2018-05-27 DIAGNOSIS — E785 Hyperlipidemia, unspecified: Secondary | ICD-10-CM | POA: Diagnosis not present

## 2018-06-03 DIAGNOSIS — E785 Hyperlipidemia, unspecified: Secondary | ICD-10-CM | POA: Diagnosis not present

## 2018-06-03 DIAGNOSIS — Z48812 Encounter for surgical aftercare following surgery on the circulatory system: Secondary | ICD-10-CM | POA: Diagnosis not present

## 2018-06-03 DIAGNOSIS — Z87891 Personal history of nicotine dependence: Secondary | ICD-10-CM | POA: Diagnosis not present

## 2018-06-03 DIAGNOSIS — I70203 Unspecified atherosclerosis of native arteries of extremities, bilateral legs: Secondary | ICD-10-CM | POA: Diagnosis not present

## 2018-06-03 DIAGNOSIS — I10 Essential (primary) hypertension: Secondary | ICD-10-CM | POA: Diagnosis not present

## 2018-06-03 DIAGNOSIS — I2510 Atherosclerotic heart disease of native coronary artery without angina pectoris: Secondary | ICD-10-CM | POA: Diagnosis not present

## 2018-06-04 ENCOUNTER — Ambulatory Visit (INDEPENDENT_AMBULATORY_CARE_PROVIDER_SITE_OTHER): Payer: Self-pay | Admitting: Vascular Surgery

## 2018-06-04 ENCOUNTER — Encounter: Payer: Self-pay | Admitting: Vascular Surgery

## 2018-06-04 ENCOUNTER — Other Ambulatory Visit: Payer: Self-pay

## 2018-06-04 VITALS — BP 109/65 | HR 69 | Temp 97.1°F | Resp 16 | Ht 68.0 in | Wt 174.0 lb

## 2018-06-04 DIAGNOSIS — I6523 Occlusion and stenosis of bilateral carotid arteries: Secondary | ICD-10-CM

## 2018-06-04 NOTE — Progress Notes (Signed)
   Patient name: Brandon Roy MRN: [id number] DOB: [birth year] Sex: male  REASON FOR VISIT: Follow-up recent left carotid endarterectomy  HPI: Brandon Roy is a 84 y.o. male here today for follow-up.  He has known right internal carotid artery occlusion and a high-grade left carotid stenosis.  He underwent left carotid endarterectomy on 05/17/2018.  He was on Plavix at the time of surgery and did have more than the usual amount of oozing during surgery.  After closure and while in the PACU he did develop a moderate hematoma in his left neck and was taken back to the operating for evacuation of this.  He did well and was discharged home without difficulty.  He had no neurologic deficits.  He reports no discomfort and is returned to his baseline  Current Outpatient Medications  Medication Sig Dispense Refill  . aspirin 81 MG tablet Take 1 tablet (81 mg total) by mouth daily.    . clopidogrel (PLAVIX) 75 MG tablet TAKE 1 TABLET (75 MG TOTAL) BY MOUTH DAILY. (Patient taking differently: Take 75 mg by mouth daily. ) 90 tablet 2  . ezetimibe (ZETIA) 10 MG tablet Take 1 tablet (10 mg total) by mouth daily. 90 tablet 3  . fenofibrate 160 MG tablet TAKE 1 TABLET (160 MG TOTAL) BY MOUTH DAILY. (Patient taking differently: Take 160 mg by mouth daily. ) 90 tablet 3  . hydrochlorothiazide (HYDRODIURIL) 12.5 MG tablet Take 12.5 mg by mouth daily.  2  . irbesartan (AVAPRO) 150 MG tablet Take 150 mg by mouth daily.   2  . metoprolol tartrate (LOPRESSOR) 50 MG tablet Take 25 mg by mouth 2 (two) times daily.    Marland Kitchen oxyCODONE-acetaminophen (PERCOCET/ROXICET) 5-325 MG tablet Take 1 tablet by mouth every 6 (six) hours as needed for moderate pain. 6 tablet 0  . rosuvastatin (CRESTOR) 20 MG tablet Take 20 mg by mouth daily.    . vitamin B-12 (CYANOCOBALAMIN) 1000 MCG tablet Take 1,000 mcg by mouth daily.      No current facility-administered medications for this visit.      PHYSICAL  EXAM: Vitals:   06/04/18 1452 06/04/18 1454  BP: 116/73 109/65  Pulse: 69   Resp: 16   Temp: (!) 97.1 F (36.2 C)   TempSrc: Oral   SpO2: 100%   Weight: 174 lb (78.9 kg)   Height: 5\' 8"  (1.727 m)     GENERAL: The patient is a well-nourished male, in no acute distress. The vital signs are documented above. Left neck incision is well-healed without bruits bilaterally.  Neurologically intact  MEDICAL ISSUES: Stable status post left carotid endarterectomy with known contralateral occlusion.  He will resume full activities and we will see him again in 9 months with carotid duplex follow-up   Rosetta Posner, MD [facility] Tel [phone] Pager [phone]

## 2018-06-05 DIAGNOSIS — Z48812 Encounter for surgical aftercare following surgery on the circulatory system: Secondary | ICD-10-CM | POA: Diagnosis not present

## 2018-06-05 DIAGNOSIS — I10 Essential (primary) hypertension: Secondary | ICD-10-CM | POA: Diagnosis not present

## 2018-06-05 DIAGNOSIS — I2510 Atherosclerotic heart disease of native coronary artery without angina pectoris: Secondary | ICD-10-CM | POA: Diagnosis not present

## 2018-06-05 DIAGNOSIS — Z87891 Personal history of nicotine dependence: Secondary | ICD-10-CM | POA: Diagnosis not present

## 2018-06-05 DIAGNOSIS — I70203 Unspecified atherosclerosis of native arteries of extremities, bilateral legs: Secondary | ICD-10-CM | POA: Diagnosis not present

## 2018-06-05 DIAGNOSIS — E785 Hyperlipidemia, unspecified: Secondary | ICD-10-CM | POA: Diagnosis not present

## 2018-08-01 ENCOUNTER — Other Ambulatory Visit: Payer: Medicare Other | Admitting: *Deleted

## 2018-08-01 DIAGNOSIS — R0989 Other specified symptoms and signs involving the circulatory and respiratory systems: Secondary | ICD-10-CM | POA: Diagnosis not present

## 2018-08-01 DIAGNOSIS — I2510 Atherosclerotic heart disease of native coronary artery without angina pectoris: Secondary | ICD-10-CM

## 2018-08-01 LAB — HEPATIC FUNCTION PANEL
ALBUMIN: 4.4 g/dL (ref 3.5–4.7)
ALT: 19 IU/L (ref 0–44)
AST: 23 IU/L (ref 0–40)
Alkaline Phosphatase: 34 IU/L — ABNORMAL LOW (ref 39–117)
Bilirubin Total: 0.5 mg/dL (ref 0.0–1.2)
Bilirubin, Direct: 0.21 mg/dL (ref 0.00–0.40)
Total Protein: 6.4 g/dL (ref 6.0–8.5)

## 2018-08-01 LAB — LIPID PANEL
CHOLESTEROL TOTAL: 127 mg/dL (ref 100–199)
Chol/HDL Ratio: 3 ratio (ref 0.0–5.0)
HDL: 43 mg/dL (ref >39)
LDL CALC: 59 mg/dL (ref 0–99)
Triglycerides: 126 mg/dL (ref 0–149)
VLDL CHOLESTEROL CAL: 25 mg/dL (ref 5–40)

## 2018-08-01 LAB — BASIC METABOLIC PANEL
BUN/Creatinine Ratio: 20 (ref 10–24)
BUN: 20 mg/dL (ref 8–27)
CALCIUM: 10.3 mg/dL — AB (ref 8.6–10.2)
CHLORIDE: 103 mmol/L (ref 96–106)
CO2: 22 mmol/L (ref 20–29)
Creatinine, Ser: 1 mg/dL (ref 0.76–1.27)
GFR calc Af Amer: 81 mL/min/{1.73_m2} (ref >59)
GFR, EST NON AFRICAN AMERICAN: 70 mL/min/{1.73_m2} (ref >59)
Glucose: 80 mg/dL (ref 65–99)
POTASSIUM: 4.1 mmol/L (ref 3.5–5.2)
Sodium: 140 mmol/L (ref 134–144)

## 2018-12-03 ENCOUNTER — Encounter (HOSPITAL_COMMUNITY): Payer: Self-pay

## 2018-12-03 ENCOUNTER — Emergency Department (HOSPITAL_COMMUNITY)
Admission: EM | Admit: 2018-12-03 | Discharge: 2018-12-04 | Disposition: A | Discharge status: Home / Self Care | Payer: Medicare Other | Attending: Emergency Medicine | Admitting: Emergency Medicine

## 2018-12-03 ENCOUNTER — Emergency Department (HOSPITAL_COMMUNITY): Payer: Medicare Other

## 2018-12-03 ENCOUNTER — Other Ambulatory Visit: Payer: Self-pay

## 2018-12-03 DIAGNOSIS — S0181XA Laceration without foreign body of other part of head, initial encounter: Secondary | ICD-10-CM | POA: Diagnosis not present

## 2018-12-03 DIAGNOSIS — Y92009 Unspecified place in unspecified non-institutional (private) residence as the place of occurrence of the external cause: Secondary | ICD-10-CM | POA: Diagnosis not present

## 2018-12-03 DIAGNOSIS — I2510 Atherosclerotic heart disease of native coronary artery without angina pectoris: Secondary | ICD-10-CM | POA: Insufficient documentation

## 2018-12-03 DIAGNOSIS — I10 Essential (primary) hypertension: Secondary | ICD-10-CM | POA: Diagnosis not present

## 2018-12-03 DIAGNOSIS — Y999 Unspecified external cause status: Secondary | ICD-10-CM | POA: Insufficient documentation

## 2018-12-03 DIAGNOSIS — Z79899 Other long term (current) drug therapy: Secondary | ICD-10-CM | POA: Diagnosis not present

## 2018-12-03 DIAGNOSIS — Z7902 Long term (current) use of antithrombotics/antiplatelets: Secondary | ICD-10-CM | POA: Insufficient documentation

## 2018-12-03 DIAGNOSIS — Z7982 Long term (current) use of aspirin: Secondary | ICD-10-CM | POA: Insufficient documentation

## 2018-12-03 DIAGNOSIS — M79652 Pain in left thigh: Secondary | ICD-10-CM | POA: Diagnosis not present

## 2018-12-03 DIAGNOSIS — R600 Localized edema: Secondary | ICD-10-CM | POA: Diagnosis not present

## 2018-12-03 DIAGNOSIS — S299XXA Unspecified injury of thorax, initial encounter: Secondary | ICD-10-CM | POA: Diagnosis not present

## 2018-12-03 DIAGNOSIS — W010XXA Fall on same level from slipping, tripping and stumbling without subsequent striking against object, initial encounter: Secondary | ICD-10-CM

## 2018-12-03 DIAGNOSIS — Y939 Activity, unspecified: Secondary | ICD-10-CM | POA: Diagnosis not present

## 2018-12-03 DIAGNOSIS — R609 Edema, unspecified: Secondary | ICD-10-CM | POA: Diagnosis not present

## 2018-12-03 DIAGNOSIS — R52 Pain, unspecified: Secondary | ICD-10-CM | POA: Diagnosis not present

## 2018-12-03 DIAGNOSIS — W19XXXA Unspecified fall, initial encounter: Secondary | ICD-10-CM | POA: Diagnosis not present

## 2018-12-03 DIAGNOSIS — Z87891 Personal history of nicotine dependence: Secondary | ICD-10-CM | POA: Diagnosis not present

## 2018-12-03 DIAGNOSIS — S7012XA Contusion of left thigh, initial encounter: Secondary | ICD-10-CM | POA: Diagnosis not present

## 2018-12-03 DIAGNOSIS — W11XXXA Fall on and from ladder, initial encounter: Secondary | ICD-10-CM | POA: Insufficient documentation

## 2018-12-03 DIAGNOSIS — Z23 Encounter for immunization: Secondary | ICD-10-CM | POA: Diagnosis not present

## 2018-12-03 DIAGNOSIS — S0101XA Laceration without foreign body of scalp, initial encounter: Secondary | ICD-10-CM | POA: Insufficient documentation

## 2018-12-03 DIAGNOSIS — Z955 Presence of coronary angioplasty implant and graft: Secondary | ICD-10-CM | POA: Diagnosis not present

## 2018-12-03 DIAGNOSIS — S79922A Unspecified injury of left thigh, initial encounter: Secondary | ICD-10-CM | POA: Diagnosis not present

## 2018-12-03 LAB — COMPREHENSIVE METABOLIC PANEL
ALT: 23 U/L (ref 0–44)
AST: 31 U/L (ref 15–41)
Albumin: 3.5 g/dL (ref 3.5–5.0)
Alkaline Phosphatase: 28 U/L — ABNORMAL LOW (ref 38–126)
Anion gap: 7 (ref 5–15)
BUN: 20 mg/dL (ref 8–23)
CO2: 22 mmol/L (ref 22–32)
Calcium: 8.8 mg/dL — ABNORMAL LOW (ref 8.9–10.3)
Chloride: 111 mmol/L (ref 98–111)
Creatinine, Ser: 1.29 mg/dL — ABNORMAL HIGH (ref 0.61–1.24)
GFR calc Af Amer: 59 mL/min — ABNORMAL LOW (ref >60)
GFR calc non Af Amer: 51 mL/min — ABNORMAL LOW (ref >60)
Glucose, Bld: 235 mg/dL — ABNORMAL HIGH (ref 70–99)
Potassium: 3.3 mmol/L — ABNORMAL LOW (ref 3.5–5.1)
Sodium: 140 mmol/L (ref 135–145)
Total Bilirubin: 0.9 mg/dL (ref 0.3–1.2)
Total Protein: 6 g/dL — ABNORMAL LOW (ref 6.5–8.1)

## 2018-12-03 LAB — CBC
HCT: 35.2 % — ABNORMAL LOW (ref 39.0–52.0)
HCT: 34.2 % — ABNORMAL LOW (ref 39.0–52.0)
Hemoglobin: 12 g/dL — ABNORMAL LOW (ref 13.0–17.0)
Hemoglobin: 11.8 g/dL — ABNORMAL LOW (ref 13.0–17.0)
MCH: 31.5 pg (ref 26.0–34.0)
MCH: 31.1 pg (ref 26.0–34.0)
MCHC: 34.1 g/dL (ref 30.0–36.0)
MCHC: 34.5 g/dL (ref 30.0–36.0)
MCV: 92.4 fL (ref 80.0–100.0)
MCV: 90.2 fL (ref 80.0–100.0)
Platelets: 303 10*3/uL (ref 150–400)
Platelets: 319 10*3/uL (ref 150–400)
RBC: 3.81 MIL/uL — ABNORMAL LOW (ref 4.22–5.81)
RBC: 3.79 MIL/uL — ABNORMAL LOW (ref 4.22–5.81)
RDW: 14.2 % (ref 11.5–15.5)
RDW: 13.9 % (ref 11.5–15.5)
WBC: 8.2 10*3/uL (ref 4.0–10.5)
WBC: 15.3 10*3/uL — ABNORMAL HIGH (ref 4.0–10.5)
nRBC: 0 % (ref 0.0–0.2)
nRBC: 0 % (ref 0.0–0.2)

## 2018-12-03 LAB — TYPE AND SCREEN
ABO/RH(D): O POS
Antibody Screen: NEGATIVE

## 2018-12-03 MED ORDER — LIDOCAINE-EPINEPHRINE (PF) 2 %-1:200000 IJ SOLN
20.0000 mL | Freq: Once | INTRAMUSCULAR | Status: AC
  Administered 2018-12-03: 20 mL
  Filled 2018-12-03: qty 20

## 2018-12-03 MED ORDER — TETANUS-DIPHTH-ACELL PERTUSSIS 5-2.5-18.5 LF-MCG/0.5 IM SUSP
0.5000 mL | Freq: Once | INTRAMUSCULAR | Status: AC
  Administered 2018-12-03: 0.5 mL via INTRAMUSCULAR
  Filled 2018-12-03: qty 0.5

## 2018-12-03 MED ORDER — HYDROMORPHONE HCL 1 MG/ML IJ SOLN
0.5000 mg | Freq: Once | INTRAMUSCULAR | Status: AC
  Administered 2018-12-04: 0.5 mg via INTRAVENOUS
  Filled 2018-12-03: qty 1

## 2018-12-03 MED ORDER — HYDROMORPHONE HCL 1 MG/ML IJ SOLN
0.5000 mg | Freq: Once | INTRAMUSCULAR | Status: AC
  Administered 2018-12-03: 0.5 mg via INTRAVENOUS
  Filled 2018-12-03: qty 1

## 2018-12-03 MED ORDER — SODIUM CHLORIDE 0.9 % IV BOLUS
1000.0000 mL | Freq: Once | INTRAVENOUS | Status: AC
  Administered 2018-12-03: 1000 mL via INTRAVENOUS

## 2018-12-03 MED ORDER — IOHEXOL 300 MG/ML  SOLN
100.0000 mL | Freq: Once | INTRAMUSCULAR | Status: AC | PRN
  Administered 2018-12-03: 100 mL via INTRAVENOUS

## 2018-12-03 NOTE — ED Notes (Signed)
ED Provider at bedside. 

## 2018-12-03 NOTE — ED Triage Notes (Signed)
Pt arrives from home via [facility], pt had a fall off of a ladder about 4 ft. Pt hit head, small lac to back of head, on plavix. Pt denies head,neck, or back pain and it alert and oriented. Pt in c-collar. Pt has left thigh pain currently 3/10. Obvious swelling to left upper thigh. Per EMS, pt walked on leg after fall. Pt denies loss of consciousness. Pt refused pain medicine by EMS.

## 2018-12-03 NOTE — ED Notes (Signed)
Pt aware pain medicine available. Pt refused at this time. Will continue to monitor.

## 2018-12-03 NOTE — ED Notes (Signed)
Patient transported to X-ray 

## 2018-12-03 NOTE — ED Notes (Addendum)
Spoke to pts wife and she knows he isgoing to be getting  xrays and CT of head and leg , wife was given phone number of the  Nurse who has him [place]

## 2018-12-03 NOTE — ED Notes (Signed)
CMP, sent by lab: Sodium- 140 Potassium- 3.3 Chloride- 111 Carbon Dioxide- 22 Glucose-235 BUN- 20 Creatinine- 1.29 Calcium- 8.8 Total protein- 6 Alkaline Phosphatase- 28 Bilirubin, Total- 0.9 FR, Est Non Af Am - 51 Anion gap- 7

## 2018-12-03 NOTE — ED Notes (Signed)
Lab confirmed they have results and will tube to ER, will look for result

## 2018-12-03 NOTE — ED Provider Notes (Signed)
2055: Pt under care of Dr Ashok Cordia.  Has scalp laceration requiring repair.  CT head negative. On plavix. Procedure tolerated well without immediate complications.  Physical Exam  BP (!) 154/72   Pulse 72   Temp 98 F (36.7 C) (Oral)   Resp 13   Ht 5\' 9"  (1.753 m)   Wt 77.1 kg   SpO2 97%   BMI 25.10 kg/m   Physical Exam Skin:    Comments: 5 cm straight laceration to posterior scalp, hemostatic and edges well approximated but with cleaning/pressure reopens exposing subcutaneous tissue only and has brisk bleeding. No galea exposed.      ED Course/Procedures     .Marland KitchenLaceration Repair Date/Time: 12/03/2018 8:55 PM Performed by: Kinnie Feil, PA-C Authorized by: Kinnie Feil, PA-C   Consent:    Consent obtained:  Verbal   Consent given by:  Patient   Risks discussed:  Infection, need for additional repair, pain, poor cosmetic result and poor wound healing   Alternatives discussed:  No treatment and delayed treatment Universal protocol:    Procedure explained and questions answered to patient or proxy's satisfaction: yes     Relevant documents present and verified: yes     Test results available and properly labeled: yes     Imaging studies available: yes     Required blood products, implants, devices, and special equipment available: yes     Site/side marked: yes     Immediately prior to procedure, a time out was called: yes     Patient identity confirmed:  Verbally with patient Anesthesia (see MAR for exact dosages):    Anesthesia method:  Local infiltration   Local anesthetic:  Lidocaine 2% WITH epi Laceration details:    Location:  Scalp   Scalp location:  Occipital   Length (cm):  5 Repair type:    Repair type:  Simple Pre-procedure details:    Preparation:  Patient was prepped and draped in usual sterile fashion and imaging obtained to evaluate for foreign bodies Exploration:    Hemostasis achieved with:  Epinephrine and direct pressure   Wound exploration:  entire depth of wound probed and visualized   Treatment:    Area cleansed with:  Betadine   Amount of cleaning:  Standard   Irrigation solution:  Sterile saline   Irrigation volume:  20 cc flushes   Irrigation method:  Syringe   Visualized foreign bodies/material removed: no   Skin repair:    Repair method:  Staples   Number of staples:  5 Approximation:    Approximation:  Close Post-procedure details:    Dressing:  Non-adherent dressing   Patient tolerance of procedure:  Tolerated well, no immediate complications     Arlean Hopping 12/03/18 2057    Lajean Saver, MD 12/03/18 [phone]

## 2018-12-03 NOTE — Discharge Instructions (Addendum)
It was our pleasure to provide your ER care today - we hope that you feel better.  Elevate leg. Cold pack intermittently to sore/swollen area. Use crutches for comfort/support.   Follow up with orthopedist later this AM - call office at 9 AM to arrange appointment - when you call, let them know that we discussed your case with Dr Veverly Fells, who indicated that they would follow up with you today.   Take acetaminophen as need for pain. You may also take ultram as need for pain - no driving for the next 6 hours, or when taking ultram.   From today's labs, your potassium is mildly low (3.3) - eat plenty of fruits and vegetables, and follow up with primary care doctor. Your blood sugar is also elevated (235) - follow up with primary care doctor.   Return to ER right away if worse, if pain acutely becomes worse or becomes severe or intractable, leg numbness or weakness, or other concern.

## 2018-12-03 NOTE — ED Provider Notes (Addendum)
[facility] EMERGENCY DEPARTMENT Provider Note   CSN: [id number] Arrival date & time: 12/03/18  1721    History   Chief Complaint Chief Complaint  Patient presents with   Fall    HPI Brandon Roy is a 85 y.o. male.     Patient s/p fall. Was doing something at sons home, on stool a few feet up, fell onto left leg. C/o left mid thigh pain, constant, mod-severe, non radiating, worse w movement. Was able to get self up, no loc - drove home. Also w contusion/laceration to scalp. Tetanus unknown. +takes plavix. No loc. No headache. No neck or back pain. Denies chest pain or sob. No abd pain or nv. Denies other pain or injury.   The history is provided by the patient.  Fall  Pertinent negatives include no chest pain, no abdominal pain, no headaches and no shortness of breath.    Past Medical History:  Diagnosis Date   CAD (coronary artery disease)    Status post recent PTCA and stenting of the proximal LAD (3.39mmx23mm Promus stent, post dilated using a 3.75 [place] sprinter) and the distal LAD   Carotid artery occlusion    HTN (hypertension)    Hyperlipidemia    Peripheral arterial disease ([place])    Pneumonia 1941   Shingles    Wears dentures    Wears glasses     Patient Active Problem List   Diagnosis Date Noted   Carotid stenosis 05/17/2018   Hyperlipidemia 11/20/2008   HTN (hypertension) 11/20/2008   CAD (coronary artery disease) 11/20/2008   OSTEOARTHRITIS, MILD 11/20/2008    Past Surgical History:  Procedure Laterality Date   CARDIAC CATHETERIZATION  2000-2011   "6 caths and 3 stents total" (05/17/2018)   CAROTID ENDARTERECTOMY Left 05/17/2018   CATARACT EXTRACTION W/ INTRAOCULAR LENS  IMPLANT, BILATERAL Bilateral    COLONOSCOPY     CORONARY ANGIOPLASTY WITH STENT PLACEMENT  Sep 24, 1998   CORONARY ANGIOPLASTY WITH STENT PLACEMENT     Status post and stenting of his left circumflex artery    ENDARTERECTOMY Left 05/17/2018   Procedure: EXPLORATION OF NECK;  Surgeon: Serafina Mitchell, MD;  Location: [facility];  Service: Vascular;  Laterality: Left;   ENDARTERECTOMY Left 05/17/2018   Procedure: LEFT ENDARTERECTOMY CAROTID;  Surgeon: Rosetta Posner, MD;  Location: [facility] OR;  Service: Vascular;  Laterality: Left;   EYE SURGERY     HEMATOMA EVACUATION Left 05/17/2018   S/P carotid endarterectomy   MULTIPLE TOOTH EXTRACTIONS     PATCH ANGIOPLASTY Left 05/17/2018   Procedure: PATCH ANGIOPLASTY USING [place];  Surgeon: Rosetta Posner, MD;  Location: [facility] OR;  Service: Vascular;  Laterality: Left;   TONSILLECTOMY          Home Medications    Prior to Admission medications   Medication Sig Start Date End Date Taking? Authorizing Provider  aspirin 81 MG tablet Take 1 tablet (81 mg total) by mouth daily. 01/05/14   Nahser, Wonda Cheng, MD  clopidogrel (PLAVIX) 75 MG tablet TAKE 1 TABLET (75 MG TOTAL) BY MOUTH DAILY. Patient taking differently: Take 75 mg by mouth daily.  06/28/15   Nahser, Wonda Cheng, MD  ezetimibe (ZETIA) 10 MG tablet Take 1 tablet (10 mg total) by mouth daily. 05/01/18   Nahser, Wonda Cheng, MD  fenofibrate 160 MG tablet TAKE 1 TABLET (160 MG TOTAL) BY MOUTH DAILY. Patient taking differently: Take 160 mg by mouth daily.  05/31/15   Nahser, Arnette Norris  J, MD  hydrochlorothiazide (HYDRODIURIL) 12.5 MG tablet Take 12.5 mg by mouth daily. 02/15/17   [provider]  irbesartan (AVAPRO) 150 MG tablet Take 150 mg by mouth daily.  02/15/17   [provider]  metoprolol tartrate (LOPRESSOR) 50 MG tablet Take 25 mg by mouth 2 (two) times daily.    [provider]  oxyCODONE-acetaminophen (PERCOCET/ROXICET) 5-325 MG tablet Take 1 tablet by mouth every 6 (six) hours as needed for moderate pain. 05/18/18   Ulyses Amor, PA-C  rosuvastatin (CRESTOR) 20 MG tablet Take 20 mg by mouth daily.    [provider]  vitamin B-12 (CYANOCOBALAMIN) 1000 MCG tablet Take 1,000 mcg by mouth  daily.     [provider]    Family History Family History  Problem Relation Age of Onset   Heart attack Father    Heart disease Father     Social History Social History   Tobacco Use   Smoking status: Former Smoker    Years: 2.00    Types: Cigarettes   Smokeless tobacco: Never Used   Tobacco comment: " Back in the 62's- 60's"  Substance Use Topics   Alcohol use: Yes    Comment: 05/17/2018 "might have up to 5 drinks/year"   Drug use: Never     Allergies   Lipitor [atorvastatin calcium]; Valsartan-hydrochlorothiazide; and Zocor [simvastatin]   Review of Systems Review of Systems  Constitutional: Negative for fever.  HENT: Negative for sore throat.   Eyes: Negative for pain and visual disturbance.  Respiratory: Negative for cough and shortness of breath.   Cardiovascular: Negative for chest pain.  Gastrointestinal: Negative for abdominal pain and vomiting.  Genitourinary: Negative for flank pain.  Musculoskeletal: Negative for back pain and neck pain.  Skin: Positive for wound.  Neurological: Negative for weakness, numbness and headaches.  Hematological: Does not bruise/bleed easily.  Psychiatric/Behavioral: Negative for confusion.     Physical Exam Updated Vital Signs BP (!) 163/77 (BP Location: Right Arm)    Pulse 75    Temp 98 F (36.7 C) (Oral)    Resp 20    Ht 1.753 m (5\' 9" )    Wt 77.1 kg    SpO2 99%    BMI 25.10 kg/m   Physical Exam Vitals signs and nursing note reviewed.  Constitutional:      Appearance: Normal appearance. He is well-developed.  HENT:     Head:     Comments: 3 cm scalp laceration.     Nose: Nose normal.     Mouth/Throat:     Mouth: Mucous membranes are moist.     Pharynx: Oropharynx is clear.  Eyes:     General: No scleral icterus.    Conjunctiva/sclera: Conjunctivae normal.     Pupils: Pupils are equal, round, and reactive to light.  Neck:     Musculoskeletal: Normal range of motion and neck supple. No neck  rigidity or muscular tenderness.     Trachea: No tracheal deviation.  Cardiovascular:     Rate and Rhythm: Normal rate and regular rhythm.     Pulses: Normal pulses.     Heart sounds: Normal heart sounds. No murmur. No friction rub. No gallop.   Pulmonary:     Effort: Pulmonary effort is normal. No accessory muscle usage or respiratory distress.     Breath sounds: Normal breath sounds.  Abdominal:     General: Bowel sounds are normal. There is no distension.     Palpations: Abdomen is soft.  Tenderness: There is no abdominal tenderness. There is no guarding.  Genitourinary:    Comments: No cva tenderness. Musculoskeletal:        General: No swelling.     Comments: CTLS spine, non tender, aligned, no step off. Moderate sts left thigh, +tenderness. Dp/pt 2+. Good passive rom at hip and knee with mild discomfort.   Skin:    General: Skin is warm and dry.     Findings: No rash.  Neurological:     Mental Status: He is alert.     Comments: Alert, speech clear. Oriented. Motor intact bil ext, stre 5/5. sens grossly intact.   Psychiatric:        Mood and Affect: Mood normal.      ED Treatments / Results  Labs (all labs ordered are listed, but only abnormal results are displayed) Results for orders placed or performed during the hospital encounter of 12/03/18  CBC  Result Value Ref Range   WBC 8.2 4.0 - 10.5 K/uL   RBC 3.81 (L) 4.22 - 5.81 MIL/uL   Hemoglobin 12.0 (L) 13.0 - 17.0 g/dL   HCT 35.2 (L) 39.0 - 52.0 %   MCV 92.4 80.0 - 100.0 fL   MCH 31.5 26.0 - 34.0 pg   MCHC 34.1 30.0 - 36.0 g/dL   RDW 14.2 11.5 - 15.5 %   Platelets 303 150 - 400 K/uL   nRBC 0.0 0.0 - 0.2 %  Comprehensive metabolic panel  Result Value Ref Range   Sodium 140 135 - 145 mmol/L   Potassium 3.3 (L) 3.5 - 5.1 mmol/L   Chloride 111 98 - 111 mmol/L   CO2 22 22 - 32 mmol/L   Glucose, Bld 235 (H) 70 - 99 mg/dL   BUN 20 8 - 23 mg/dL   Creatinine, Ser 1.29 (H) 0.61 - 1.24 mg/dL   Calcium 8.8 (L)  8.9 - 10.3 mg/dL   Total Protein 6.0 (L) 6.5 - 8.1 g/dL   Albumin 3.5 3.5 - 5.0 g/dL   AST 31 15 - 41 U/L   ALT 23 0 - 44 U/L   Alkaline Phosphatase 28 (L) 38 - 126 U/L   Total Bilirubin 0.9 0.3 - 1.2 mg/dL   GFR calc non Af Amer 51 (L) >60 mL/min   GFR calc Af Amer 59 (L) >60 mL/min   Anion gap 7 5 - 15  CBC  Result Value Ref Range   WBC 15.3 (H) 4.0 - 10.5 K/uL   RBC 3.79 (L) 4.22 - 5.81 MIL/uL   Hemoglobin 11.8 (L) 13.0 - 17.0 g/dL   HCT 34.2 (L) 39.0 - 52.0 %   MCV 90.2 80.0 - 100.0 fL   MCH 31.1 26.0 - 34.0 pg   MCHC 34.5 30.0 - 36.0 g/dL   RDW 13.9 11.5 - 15.5 %   Platelets 319 150 - 400 K/uL   nRBC 0.0 0.0 - 0.2 %  Type and screen  Result Value Ref Range   ABO/RH(D) O POS    Antibody Screen NEG    Sample Expiration      12/06/2018,2359 Performed at [facility], [address] [place], [place] [address]    Dg Chest 1 View  Result Date: 12/03/2018 CLINICAL DATA:  Pain status post fall EXAM: CHEST  1 VIEW COMPARISON:  Chest x-ray dated 05/14/2011 FINDINGS: The cardiac silhouette is mildly enlarged. There is no pneumothorax. No large pleural effusion. No large area of consolidation. Atherosclerotic changes are noted of the abdominal  aorta. A coronary stent is noted. IMPRESSION: No acute cardiopulmonary process identified. Mild cardiomegaly is noted. Electronically Signed   By: Constance Holster M.D.   On: 12/03/2018 18:05   Ct Head Wo Contrast  Result Date: 12/03/2018 CLINICAL DATA:  Fall from a ladder, about forefeet, striking head. Laceration along the back of the head. Anti coagulation. EXAM: CT HEAD WITHOUT CONTRAST TECHNIQUE: Contiguous axial images were obtained from the base of the skull through the vertex without intravenous contrast. COMPARISON:  05/14/2011 FINDINGS: Brain: The brainstem, cerebellum, cerebral peduncles, thalami, basal ganglia, basilar cisterns, and ventricular system appear within normal limits. No intracranial hemorrhage, mass lesion, or acute  CVA. Vascular: There is atherosclerotic calcification of the cavernous carotid arteries bilaterally. Skull: Unremarkable Sinuses/Orbits: Mild chronic right maxillary and ethmoid sinusitis. Other: No significant scalp hematoma. IMPRESSION: 1. No acute intracranial findings. 2. Atherosclerosis. 3. Mild chronic right maxillary and ethmoid sinusitis. Electronically Signed   By: Van Clines M.D.   On: 12/03/2018 18:27   Ct Angio Ao+bifem W & Or Wo Contrast  Result Date: 12/03/2018 CLINICAL DATA:  Left upper leg trauma with severe swelling EXAM: CT ANGIOGRAPHY OF ABDOMINAL AORTA WITH ILIOFEMORAL RUNOFF TECHNIQUE: Multidetector CT imaging of the abdomen, pelvis and lower extremities was performed using the standard protocol during bolus administration of intravenous contrast. Multiplanar CT image reconstructions and MIPs were obtained to evaluate the vascular anatomy. CONTRAST:  157mL OMNIPAQUE IOHEXOL 300 MG/ML  SOLN COMPARISON:  None. FINDINGS: VASCULAR Aorta: Normal caliber aorta without aneurysm, dissection, vasculitis or significant stenosis. Abdominal aortic atherosclerosis. Celiac: Patent without evidence of aneurysm, dissection, vasculitis or significant stenosis. Mild atherosclerosis at the origin. SMA: Patent without evidence of aneurysm, dissection, vasculitis or significant stenosis. Atherosclerotic plaque at the origin. Renals: Bilateral renal arteries are patent with atherosclerotic plaque at the origin. IMA: Patent without evidence of aneurysm, dissection, vasculitis or significant stenosis. RIGHT Lower Extremity Inflow: Common, internal and external iliac arteries are patent without evidence of aneurysm, dissection, vasculitis or significant stenosis. Atherosclerotic plaque is present in the common iliac artery. Outflow: Common, superficial and profunda femoral arteries and the popliteal artery are patent without evidence of aneurysm, dissection, vasculitis or significant stenosis. Scattered areas  of atherosclerosis in the common femoral artery and superficial femoral artery. Runoff: Popliteal artery is patent without evidence of aneurysm, dissection or vasculitis. LEFT Lower Extremity Inflow: Common, internal and external iliac arteries are patent without evidence of aneurysm, dissection, vasculitis or significant stenosis. Atherosclerotic plaque is present in the common iliac artery. Outflow: Common, superficial and profunda femoral arteries and the popliteal artery are patent without evidence of aneurysm, dissection, vasculitis or significant stenosis. Scattered areas of atherosclerosis in the common femoral artery and superficial femoral artery. Runoff: Popliteal artery is patent without evidence of aneurysm, dissection or vasculitis. Veins: No obvious venous abnormality within the limitations of this arterial phase study. Review of the MIP images confirms the above findings. NON-VASCULAR Lower chest: No acute abnormality. Hepatobiliary: No focal liver abnormality is seen. Gallstones without gallbladder wall thickening or surrounding inflammatory changes. No intrahepatic or extrahepatic biliary ductal dilatation. Pancreas: Unremarkable. No pancreatic ductal dilatation or surrounding inflammatory changes. Spleen: Normal in size without focal abnormality. Adrenals/Urinary Tract: Adrenal glands are unremarkable. Kidneys are normal, without renal calculi, focal lesion, or hydronephrosis. Bladder is unremarkable. Stomach/Bowel: Stomach is within normal limits. Appendix appears normal. No evidence of bowel wall thickening, distention, or inflammatory changes. Lymphatic: No lymphadenopathy. Reproductive: Prostate is unremarkable. Other: No abdominal wall hernia or abnormality. No abdominopelvic ascites.  Musculoskeletal: No acute osseous abnormality. No aggressive osseous lesion. Severe expansion of the left vastus intermedius muscle and vastus lateralis muscle most consistent with a large intramuscular hematoma  with exact dimensions difficult to measure as a hematoma is isodense to the adjacent muscle. Small amount of soft tissue edema in the subcutaneous fat along the lateral aspect of the left thigh. IMPRESSION: VASCULAR 1. Atherosclerosis and peripheral vascular atherosclerotic disease without significant stenosis. NON-VASCULAR 1. Severe expansion of the left vastus intermedius muscle and vastus lateralis muscle most consistent with a large intramuscular hematoma with exact dimensions difficult to measure as a hematoma is isodense to the adjacent muscle. The left vastus intermedius and vastus lateralis muscles are 4 times size of the contralateral right musculature. Small amount of soft tissue edema in the subcutaneous fat along the lateral aspect of the left thigh. Electronically Signed   By: Kathreen Devoid   On: 12/03/2018 21:04   Dg Femur Min 2 Views Left  Result Date: 12/03/2018 CLINICAL DATA:  Pain status post fall EXAM: LEFT FEMUR 2 VIEWS COMPARISON:  None. FINDINGS: There is no evidence of fracture or other focal bone lesions. Soft tissues are unremarkable. Atherosclerotic changes are noted of the left lower extremity vasculature. IMPRESSION: Negative. Electronically Signed   By: Constance Holster M.D.   On: 12/03/2018 18:07    EKG None  Radiology Dg Chest 1 View  Result Date: 12/03/2018 CLINICAL DATA:  Pain status post fall EXAM: CHEST  1 VIEW COMPARISON:  Chest x-ray dated 05/14/2011 FINDINGS: The cardiac silhouette is mildly enlarged. There is no pneumothorax. No large pleural effusion. No large area of consolidation. Atherosclerotic changes are noted of the abdominal aorta. A coronary stent is noted. IMPRESSION: No acute cardiopulmonary process identified. Mild cardiomegaly is noted. Electronically Signed   By: Constance Holster M.D.   On: 12/03/2018 18:05   Ct Head Wo Contrast  Result Date: 12/03/2018 CLINICAL DATA:  Fall from a ladder, about forefeet, striking head. Laceration along the back  of the head. Anti coagulation. EXAM: CT HEAD WITHOUT CONTRAST TECHNIQUE: Contiguous axial images were obtained from the base of the skull through the vertex without intravenous contrast. COMPARISON:  05/14/2011 FINDINGS: Brain: The brainstem, cerebellum, cerebral peduncles, thalami, basal ganglia, basilar cisterns, and ventricular system appear within normal limits. No intracranial hemorrhage, mass lesion, or acute CVA. Vascular: There is atherosclerotic calcification of the cavernous carotid arteries bilaterally. Skull: Unremarkable Sinuses/Orbits: Mild chronic right maxillary and ethmoid sinusitis. Other: No significant scalp hematoma. IMPRESSION: 1. No acute intracranial findings. 2. Atherosclerosis. 3. Mild chronic right maxillary and ethmoid sinusitis. Electronically Signed   By: Van Clines M.D.   On: 12/03/2018 18:27   Ct Angio Ao+bifem W & Or Wo Contrast  Result Date: 12/03/2018 CLINICAL DATA:  Left upper leg trauma with severe swelling EXAM: CT ANGIOGRAPHY OF ABDOMINAL AORTA WITH ILIOFEMORAL RUNOFF TECHNIQUE: Multidetector CT imaging of the abdomen, pelvis and lower extremities was performed using the standard protocol during bolus administration of intravenous contrast. Multiplanar CT image reconstructions and MIPs were obtained to evaluate the vascular anatomy. CONTRAST:  111mL OMNIPAQUE IOHEXOL 300 MG/ML  SOLN COMPARISON:  None. FINDINGS: VASCULAR Aorta: Normal caliber aorta without aneurysm, dissection, vasculitis or significant stenosis. Abdominal aortic atherosclerosis. Celiac: Patent without evidence of aneurysm, dissection, vasculitis or significant stenosis. Mild atherosclerosis at the origin. SMA: Patent without evidence of aneurysm, dissection, vasculitis or significant stenosis. Atherosclerotic plaque at the origin. Renals: Bilateral renal arteries are patent with atherosclerotic plaque at the origin. IMA: Patent  without evidence of aneurysm, dissection, vasculitis or significant  stenosis. RIGHT Lower Extremity Inflow: Common, internal and external iliac arteries are patent without evidence of aneurysm, dissection, vasculitis or significant stenosis. Atherosclerotic plaque is present in the common iliac artery. Outflow: Common, superficial and profunda femoral arteries and the popliteal artery are patent without evidence of aneurysm, dissection, vasculitis or significant stenosis. Scattered areas of atherosclerosis in the common femoral artery and superficial femoral artery. Runoff: Popliteal artery is patent without evidence of aneurysm, dissection or vasculitis. LEFT Lower Extremity Inflow: Common, internal and external iliac arteries are patent without evidence of aneurysm, dissection, vasculitis or significant stenosis. Atherosclerotic plaque is present in the common iliac artery. Outflow: Common, superficial and profunda femoral arteries and the popliteal artery are patent without evidence of aneurysm, dissection, vasculitis or significant stenosis. Scattered areas of atherosclerosis in the common femoral artery and superficial femoral artery. Runoff: Popliteal artery is patent without evidence of aneurysm, dissection or vasculitis. Veins: No obvious venous abnormality within the limitations of this arterial phase study. Review of the MIP images confirms the above findings. NON-VASCULAR Lower chest: No acute abnormality. Hepatobiliary: No focal liver abnormality is seen. Gallstones without gallbladder wall thickening or surrounding inflammatory changes. No intrahepatic or extrahepatic biliary ductal dilatation. Pancreas: Unremarkable. No pancreatic ductal dilatation or surrounding inflammatory changes. Spleen: Normal in size without focal abnormality. Adrenals/Urinary Tract: Adrenal glands are unremarkable. Kidneys are normal, without renal calculi, focal lesion, or hydronephrosis. Bladder is unremarkable. Stomach/Bowel: Stomach is within normal limits. Appendix appears normal. No  evidence of bowel wall thickening, distention, or inflammatory changes. Lymphatic: No lymphadenopathy. Reproductive: Prostate is unremarkable. Other: No abdominal wall hernia or abnormality. No abdominopelvic ascites. Musculoskeletal: No acute osseous abnormality. No aggressive osseous lesion. Severe expansion of the left vastus intermedius muscle and vastus lateralis muscle most consistent with a large intramuscular hematoma with exact dimensions difficult to measure as a hematoma is isodense to the adjacent muscle. Small amount of soft tissue edema in the subcutaneous fat along the lateral aspect of the left thigh. IMPRESSION: VASCULAR 1. Atherosclerosis and peripheral vascular atherosclerotic disease without significant stenosis. NON-VASCULAR 1. Severe expansion of the left vastus intermedius muscle and vastus lateralis muscle most consistent with a large intramuscular hematoma with exact dimensions difficult to measure as a hematoma is isodense to the adjacent muscle. The left vastus intermedius and vastus lateralis muscles are 4 times size of the contralateral right musculature. Small amount of soft tissue edema in the subcutaneous fat along the lateral aspect of the left thigh. Electronically Signed   By: Kathreen Devoid   On: 12/03/2018 21:04   Dg Femur Min 2 Views Left  Result Date: 12/03/2018 CLINICAL DATA:  Pain status post fall EXAM: LEFT FEMUR 2 VIEWS COMPARISON:  None. FINDINGS: There is no evidence of fracture or other focal bone lesions. Soft tissues are unremarkable. Atherosclerotic changes are noted of the left lower extremity vasculature. IMPRESSION: Negative. Electronically Signed   By: Constance Holster M.D.   On: 12/03/2018 18:07    Procedures Procedures (including critical care time)  Medications Ordered in ED Medications  HYDROmorphone (DILAUDID) injection 0.5 mg (has no administration in time range)  lidocaine-EPINEPHrine (XYLOCAINE W/EPI) 2 %-1:200000 (PF) injection 20 mL (has no  administration in time range)  Tdap (BOOSTRIX) injection 0.5 mL (has no administration in time range)     Initial Impression / Assessment and Plan / ED Course  I have reviewed the triage vital signs and the nursing notes.  Pertinent labs & imaging  results that were available during my care of the patient were reviewed by me and considered in my medical decision making (see chart for details).  Iv ns. Dilaudid .5 mg iv.   Reviewed nursing notes and prior charts for additional history.   Labs and imaging.   Tetanus IM.   Labs reviewed by me - k sl low.   Ct reviewed by me - large intramuscular hematoma.   Scalp laceration sutured by APP.   Recheck, CTLS spine, non tender, aligned, no step off. Recheck left thigh - degree of swelling comparable to prior. Pain improved but persists. Dilaudid .5 mg iv. Distal pulse palp. No numbness. Given degree of swelling noted on exam/CT, orthopedics consulted.   I spoke with ortho on call, Dr Veverly Fells, who reviewed film, discussed pts symptoms, plavix therapy, pts exam - he indicates feels compartment syndrome unlikely and to tx w rest, ice, elev - he indicates observe a total of 6 hrs in ED, and if no significant progression of exam findings (I.e uncontrolled pain, numbness, etc)  they can f/u in office or tele tomorrow, and indicates if pts pain becomes intractable/out of proportion to call back.   Recheck pt again at 2351, approximately 6.5 hours after ED arrival - size of hematoma/amount of swelling appears c/w initial. Patients pain is controlled, and not out of proportion to injury/exam findings. There is no numbness/weakness. LLE/foot nvi. Dp/pt palp. Lower ext/foot of normal color and warmth.  Able to passively move/flex LLE at knee without severe pain. Hr currently 94, rr 16. bp stable. Repeat hgb very close to initial.   Patient currently appears stable for d/c.   To f/u ortho in AM.   Return precautions provided.       Final Clinical  Impressions(s) / ED Diagnoses   Final diagnoses:  None    ED Discharge Orders    None           Lajean Saver, MD 12/04/18 [phone]

## 2018-12-04 DIAGNOSIS — S7012XA Contusion of left thigh, initial encounter: Secondary | ICD-10-CM | POA: Diagnosis not present

## 2018-12-04 MED ORDER — TRAMADOL HCL 50 MG PO TABS: 50.0000 mg | ORAL_TABLET | Freq: Four times a day (QID) | ORAL | 0 refills | Status: DC | PRN

## 2018-12-04 NOTE — ED Notes (Signed)
Reviewed d/c instructions with pt, who verbalized understanding and had no outstanding questions. Pt departed in NAD.   

## 2018-12-16 DIAGNOSIS — Z4802 Encounter for removal of sutures: Secondary | ICD-10-CM | POA: Diagnosis not present

## 2018-12-16 DIAGNOSIS — S0191XA Laceration without foreign body of unspecified part of head, initial encounter: Secondary | ICD-10-CM | POA: Diagnosis not present

## 2018-12-19 DIAGNOSIS — E538 Deficiency of other specified B group vitamins: Secondary | ICD-10-CM | POA: Diagnosis not present

## 2018-12-19 DIAGNOSIS — Z125 Encounter for screening for malignant neoplasm of prostate: Secondary | ICD-10-CM | POA: Diagnosis not present

## 2018-12-19 DIAGNOSIS — E7849 Other hyperlipidemia: Secondary | ICD-10-CM | POA: Diagnosis not present

## 2018-12-25 DIAGNOSIS — S7012XD Contusion of left thigh, subsequent encounter: Secondary | ICD-10-CM | POA: Diagnosis not present

## 2018-12-26 DIAGNOSIS — Z1331 Encounter for screening for depression: Secondary | ICD-10-CM | POA: Diagnosis not present

## 2018-12-26 DIAGNOSIS — H3530 Unspecified macular degeneration: Secondary | ICD-10-CM | POA: Diagnosis not present

## 2018-12-26 DIAGNOSIS — I6529 Occlusion and stenosis of unspecified carotid artery: Secondary | ICD-10-CM | POA: Diagnosis not present

## 2018-12-26 DIAGNOSIS — I119 Hypertensive heart disease without heart failure: Secondary | ICD-10-CM | POA: Diagnosis not present

## 2018-12-26 DIAGNOSIS — Z1339 Encounter for screening examination for other mental health and behavioral disorders: Secondary | ICD-10-CM | POA: Diagnosis not present

## 2018-12-26 DIAGNOSIS — R972 Elevated prostate specific antigen [PSA]: Secondary | ICD-10-CM | POA: Diagnosis not present

## 2018-12-26 DIAGNOSIS — I2510 Atherosclerotic heart disease of native coronary artery without angina pectoris: Secondary | ICD-10-CM | POA: Diagnosis not present

## 2018-12-26 DIAGNOSIS — N9489 Other specified conditions associated with female genital organs and menstrual cycle: Secondary | ICD-10-CM | POA: Diagnosis not present

## 2018-12-26 DIAGNOSIS — D692 Other nonthrombocytopenic purpura: Secondary | ICD-10-CM | POA: Diagnosis not present

## 2018-12-26 DIAGNOSIS — M1990 Unspecified osteoarthritis, unspecified site: Secondary | ICD-10-CM | POA: Diagnosis not present

## 2018-12-26 DIAGNOSIS — Z0000 Encounter for general adult medical examination without abnormal findings: Secondary | ICD-10-CM | POA: Diagnosis not present

## 2018-12-26 DIAGNOSIS — E538 Deficiency of other specified B group vitamins: Secondary | ICD-10-CM | POA: Diagnosis not present

## 2018-12-26 DIAGNOSIS — I700 Atherosclerosis of aorta: Secondary | ICD-10-CM | POA: Diagnosis not present

## 2018-12-26 DIAGNOSIS — E785 Hyperlipidemia, unspecified: Secondary | ICD-10-CM | POA: Diagnosis not present

## 2019-01-09 DIAGNOSIS — H35373 Puckering of macula, bilateral: Secondary | ICD-10-CM | POA: Diagnosis not present

## 2019-01-09 DIAGNOSIS — H26493 Other secondary cataract, bilateral: Secondary | ICD-10-CM | POA: Diagnosis not present

## 2019-01-09 DIAGNOSIS — Z961 Presence of intraocular lens: Secondary | ICD-10-CM | POA: Diagnosis not present

## 2019-01-09 DIAGNOSIS — H524 Presbyopia: Secondary | ICD-10-CM | POA: Diagnosis not present

## 2019-01-21 DIAGNOSIS — H26491 Other secondary cataract, right eye: Secondary | ICD-10-CM | POA: Diagnosis not present

## 2019-05-09 ENCOUNTER — Other Ambulatory Visit: Payer: Self-pay | Admitting: Cardiovascular Disease

## 2019-06-08 NOTE — Progress Notes (Signed)
Brandon Roy Date of Birth  1934/01/10       [facility]    [employer] [address] [place], [place], [place]  [address]    [employer], [place]  [address] [phone]     [phone]   Fax  [phone]     Fax [phone]  Problem List: 1. Coronary artery disease-status post PTCA and stenting of his proximal LAD using a 3.5 x 23 mm and distal LAD stent using a 2.25 mm Taxus stent. [name] also status post stenting of the right coronary artery using a 3.0 x 15 mm Promus stent dilated with a 3.5 mm noncompliant balloon. 2. Hypertension 3. Hyperlipidemia   Brandon Roy is a 85 year old gentleman with a history of coronary artery disease. Status post PTCA and stenting of his proximal left anterior descending artery using a 3.5 mm x 23 mm stent. He also has a stent in his distal left anterior descending artery-2.25 mm Taxus stent) she's.  He is also status post PTCA and stenting of the right coronary artery using a 3.0 x 15 mm Primus stent post dilated using a 3.5 mm noncompliant balloon.  He also has a history of hypertension and hyperlipidemia. His pharmacy changed him to generic Diovan/HCT. Following this he developed an itchy rash. He went back to the Brand name Diovan and feels much better.  January 02, 2013:  Brandon Roy is doing well.  No CP.  Staying busy.    01/05/2014:  Brandon Roy is doing ok.  Able to do all of his normal activities. [name] not having any chest pain.  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    February 04, 2016:  Staying busy.  No angina   Aug. 27, 2018:  Doing well. Staying active.   Going camping with grandson. Lipids are managed by Dr. Virgina Jock.    Oct. 2, 2019: Doing well.    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   Nov. 9, 2020   Brandon Roy is seen today for follow up of his CAD Found to have severe carotid disease.   Now s/p L CEA . Has severe right carotid  disease also .   No CP     Current Outpatient Medications on File Prior to Visit  Medication Sig Dispense Refill  . aspirin 81 MG tablet Take 1 tablet (81 mg total) by mouth daily.    . clopidogrel (PLAVIX) 75 MG tablet TAKE 1 TABLET (75 MG TOTAL) BY MOUTH DAILY. 90 tablet 2  . ezetimibe (ZETIA) 10 MG tablet Take 1 tablet (10 mg total) by mouth daily. Please keep upcoming appt with Dr. Acie Fredrickson in November before anymore refills. Thank you 90 tablet 0  . fenofibrate 160 MG tablet TAKE 1 TABLET (160 MG TOTAL) BY MOUTH DAILY. 90 tablet 3  . hydrochlorothiazide (HYDRODIURIL) 12.5 MG tablet Take 12.5 mg by mouth daily.  2  . irbesartan (AVAPRO) 300 MG tablet Take 1 tablet by mouth daily.    . metoprolol tartrate (LOPRESSOR) 50 MG tablet Take 25 mg by mouth 2 (two) times daily.    . rosuvastatin (CRESTOR) 20 MG tablet Take 20 mg by mouth daily.    . valsartan-hydrochlorothiazide (DIOVAN-HCT) 320-12.5 MG tablet Take 1 tablet by mouth daily.    . vitamin B-12 (CYANOCOBALAMIN) 1000 MCG tablet Take 1,000 mcg by mouth at bedtime.  No current facility-administered medications on file prior to visit.     Allergies  Allergen Reactions  . Cinnamon Itching  . Lipitor [Atorvastatin Calcium] Other (See Comments)    Reaction not recalled- just "didn't work well with me"  . Valsartan-Hydrochlorothiazide Itching    The generic version (Valsartan-HCTZ) caused patient to itch  . Zocor [Simvastatin] Other (See Comments)    Reaction not recalled- just "didn't work well with me"    Past Medical History:  Diagnosis Date  . CAD (coronary artery disease)    Status post recent PTCA and stenting of the proximal LAD (3.23mmx23mm Promus stent, post dilated using a 3.75 [place] sprinter) and the distal LAD  . Carotid artery occlusion   . HTN (hypertension)   . Hyperlipidemia   . Peripheral arterial disease ([place])   . Pneumonia 1941  . Shingles   . Wears dentures   . Wears glasses     Past Surgical History:   Procedure Laterality Date  . CARDIAC CATHETERIZATION  2000-2011   "6 caths and 3 stents total" (05/17/2018)  . CAROTID ENDARTERECTOMY Left 05/17/2018  . CATARACT EXTRACTION W/ INTRAOCULAR LENS  IMPLANT, BILATERAL Bilateral   . COLONOSCOPY    . CORONARY ANGIOPLASTY WITH STENT PLACEMENT  Sep 24, 1998  . CORONARY ANGIOPLASTY WITH STENT PLACEMENT     Status post and stenting of his left circumflex artery   . ENDARTERECTOMY Left 05/17/2018   Procedure: EXPLORATION OF NECK;  Surgeon: Serafina Mitchell, MD;  Location: [facility];  Service: Vascular;  Laterality: Left;  . ENDARTERECTOMY Left 05/17/2018   Procedure: LEFT ENDARTERECTOMY CAROTID;  Surgeon: Rosetta Posner, MD;  Location: [facility];  Service: Vascular;  Laterality: Left;  . EYE SURGERY    . HEMATOMA EVACUATION Left 05/17/2018   S/P carotid endarterectomy  . MULTIPLE TOOTH EXTRACTIONS    . PATCH ANGIOPLASTY Left 05/17/2018   Procedure: PATCH ANGIOPLASTY USING HEMASHIELD PLATINUM FINESSE;  Surgeon: Rosetta Posner, MD;  Location: [facility];  Service: Vascular;  Laterality: Left;  . TONSILLECTOMY      Social History   Tobacco Use  Smoking Status Former Smoker  . Years: 2.00  . Types: Cigarettes  Smokeless Tobacco Never Used  Tobacco Comment   " Back in the 50's- 60's"    Social History   Substance and Sexual Activity  Alcohol Use Yes   Comment: 05/17/2018 "might have up to 5 drinks/year"    Family History  Problem Relation Age of Onset  . Heart attack Father   . Heart disease Father     Reviw of Systems:  Reviewed in the HPI.  All other systems are negative.  Physical Exam: Blood pressure 114/72, pulse (!) 55, height 5\' 9"  (1.753 m), weight 178 lb (80.7 kg), SpO2 98 %.  GEN:  Well nourished, well developed in no acute distress HEENT: Normal NECK: No JVD; No carotid bruits LYMPHATICS: No lymphadenopathy CARDIAC: RRR , no murmurs, rubs, gallops RESPIRATORY:  Clear to auscultation without rales, wheezing or rhonchi  ABDOMEN:  Soft, non-tender, non-distended MUSCULOSKELETAL:  No edema; No deformity  SKIN: Warm and dry NEUROLOGIC:  Alert and oriented x 3     ECG: Which is okay June 09, 2019: Sinus bradycardia at 55.  First-degree AV block.  Otherwise normal EKG.  Assessment / Plan:    1. Coronary artery disease-status post PTCA and stenting of his proximal LAD using a 3.5 x 23 mm and distal LAD stent using a 2.25 mm Taxus stent. [name] also  status post stenting of the right coronary artery using a 3.0 x 15 mm Promus stent dilated with a 3.5 mm noncompliant balloon.  Is doing well.  Is not had any angina.  He remains fairly active.   2. Hypertension -   blood pressures well controlled.  Continue current medications.  He is on valsartan HCTZ 12.5 mg a day as well as a separate HCTZ 12.5 mg tablet.  His potassium was a little low when checked in May.  We will add potassium chloride 10 mEq a day and recheck basic metabolic profile in 3 weeks.  3. Hyperlipidemia -  Continue with rosuvastatin 20 mg a day and Zetia 10 mg a day.  We will check lipid panel, liver enzymes and basic metabolic profile in 3 weeks.  4.   Carotid artery disease. : Was found to have a severe left carotid bruit.  He also has severe disease on the right.  He is now status post left carotid endarterectomy.   Mertie Moores, MD  06/09/2019 6:12 PM    [facility] [place],  [address], [place]  [address] Pager [phone] Phone: [phone]; Fax: [phone]

## 2019-06-09 ENCOUNTER — Other Ambulatory Visit: Payer: Self-pay

## 2019-06-09 ENCOUNTER — Encounter: Payer: Self-pay | Admitting: Cardiovascular Disease

## 2019-06-09 ENCOUNTER — Ambulatory Visit (INDEPENDENT_AMBULATORY_CARE_PROVIDER_SITE_OTHER): Payer: Medicare Other | Admitting: Cardiovascular Disease

## 2019-06-09 VITALS — BP 114/72 | HR 55 | Ht 69.0 in | Wt 178.0 lb

## 2019-06-09 DIAGNOSIS — I10 Essential (primary) hypertension: Secondary | ICD-10-CM | POA: Diagnosis not present

## 2019-06-09 DIAGNOSIS — I6523 Occlusion and stenosis of bilateral carotid arteries: Secondary | ICD-10-CM | POA: Diagnosis not present

## 2019-06-09 DIAGNOSIS — I2510 Atherosclerotic heart disease of native coronary artery without angina pectoris: Secondary | ICD-10-CM | POA: Diagnosis not present

## 2019-06-09 NOTE — Patient Instructions (Addendum)
Medication Instructions:  Your physician has recommended you make the following change in your medication:  START Kdur (Potassium chloride) 10 mEq once daily  *If you need a refill on your cardiac medications before your next appointment, please call your pharmacy*  Lab Work: Your physician recommends that you return for lab work on Tuesday Dec. 1 You may come in anytime that day after 7:30 am  You will need to FAST for this appointment - nothing to eat or drink after midnight the night before except water.   If you have labs (blood work) drawn today and your tests are completely normal, you will receive your results only by: Marland Kitchen MyChart Message (if you have MyChart) OR . A paper copy in the mail If you have any lab test that is abnormal or we need to change your treatment, we will call you to review the results.   Testing/Procedures: None Ordered   Follow-Up: At [facility], you and your health needs are our priority.  As part of our continuing mission to provide you with exceptional heart care, we have created designated Provider Care Teams.  These Care Teams include your primary Cardiologist (physician) and Advanced Practice Providers (APPs -  Physician Assistants and Nurse Practitioners) who all work together to provide you with the care you need, when you need it.  Your next appointment:   12 months  The format for your next appointment:   In Person  Provider:   You may see Mertie Moores, MD or one of the following Advanced Practice Providers on your designated Care Team:    Richardson Dopp, PA-C  [place], [place]  Daune Perch, [place]

## 2019-06-30 DIAGNOSIS — E785 Hyperlipidemia, unspecified: Secondary | ICD-10-CM | POA: Diagnosis not present

## 2019-06-30 DIAGNOSIS — I119 Hypertensive heart disease without heart failure: Secondary | ICD-10-CM | POA: Diagnosis not present

## 2019-06-30 DIAGNOSIS — I2510 Atherosclerotic heart disease of native coronary artery without angina pectoris: Secondary | ICD-10-CM | POA: Diagnosis not present

## 2019-06-30 DIAGNOSIS — I700 Atherosclerosis of aorta: Secondary | ICD-10-CM | POA: Diagnosis not present

## 2019-06-30 DIAGNOSIS — H269 Unspecified cataract: Secondary | ICD-10-CM | POA: Diagnosis not present

## 2019-07-01 ENCOUNTER — Other Ambulatory Visit: Payer: Self-pay

## 2019-07-01 ENCOUNTER — Telehealth: Payer: Self-pay

## 2019-07-01 ENCOUNTER — Other Ambulatory Visit: Payer: Medicare Other

## 2019-07-01 DIAGNOSIS — I10 Essential (primary) hypertension: Secondary | ICD-10-CM | POA: Diagnosis not present

## 2019-07-01 DIAGNOSIS — I6523 Occlusion and stenosis of bilateral carotid arteries: Secondary | ICD-10-CM

## 2019-07-01 DIAGNOSIS — I2510 Atherosclerotic heart disease of native coronary artery without angina pectoris: Secondary | ICD-10-CM | POA: Diagnosis not present

## 2019-07-01 LAB — HEPATIC FUNCTION PANEL
ALT: 19 IU/L (ref 0–44)
AST: 28 IU/L (ref 0–40)
Albumin: 4 g/dL (ref 3.6–4.6)
Alkaline Phosphatase: 31 IU/L — ABNORMAL LOW (ref 39–117)
Bilirubin Total: 0.5 mg/dL (ref 0.0–1.2)
Bilirubin, Direct: 0.23 mg/dL (ref 0.00–0.40)
Total Protein: 6.3 g/dL (ref 6.0–8.5)

## 2019-07-01 LAB — BASIC METABOLIC PANEL
BUN/Creatinine Ratio: 27 — ABNORMAL HIGH (ref 10–24)
BUN: 28 mg/dL — ABNORMAL HIGH (ref 8–27)
CO2: 21 mmol/L (ref 20–29)
Calcium: 9.6 mg/dL (ref 8.6–10.2)
Chloride: 105 mmol/L (ref 96–106)
Creatinine, Ser: 1.05 mg/dL (ref 0.76–1.27)
GFR calc Af Amer: 76 mL/min/{1.73_m2} (ref >59)
GFR calc non Af Amer: 65 mL/min/{1.73_m2} (ref >59)
Glucose: 89 mg/dL (ref 65–99)
Potassium: 3.8 mmol/L (ref 3.5–5.2)
Sodium: 140 mmol/L (ref 134–144)

## 2019-07-01 LAB — LIPID PANEL
Chol/HDL Ratio: 2.8 ratio (ref 0.0–5.0)
Cholesterol, Total: 106 mg/dL (ref 100–199)
HDL: 38 mg/dL — ABNORMAL LOW (ref >39)
LDL Chol Calc (NIH): 51 mg/dL (ref 0–99)
Triglycerides: 85 mg/dL (ref 0–149)
VLDL Cholesterol Cal: 17 mg/dL (ref 5–40)

## 2019-07-01 MED ORDER — POTASSIUM CHLORIDE ER 10 MEQ PO TBCR: 10.0000 meq | EXTENDED_RELEASE_TABLET | Freq: Every day | ORAL | 3 refills | Status: DC

## 2019-07-01 NOTE — Telephone Encounter (Signed)
The patient came in for lab work and stated that he was supposed to start Potassium, but nothing was ordered, according to his pharmacy.  I ordered the Potassium 10 meq Daily from his pharmacy.

## 2019-08-17 ENCOUNTER — Other Ambulatory Visit: Payer: Self-pay | Admitting: Cardiovascular Disease

## 2019-09-15 IMAGING — CT CT HEAD WITHOUT CONTRAST
4 series · 17 of 47 positions shown, 19 images · non-contrast
Comparison: 05/14/2011

CLINICAL DATA: Fall from a ladder, about forefeet, striking head.
Laceration along the back of the head. Anti coagulation.

EXAM:
CT HEAD WITHOUT CONTRAST
TECHNIQUE: Contiguous axial images were obtained from the base of the skull
through the vertex without intravenous contrast.

[Series 3: head without · axial · non-contrast · 0.41mm/px · z∈[-114,+16]mm · 7 of 36 slices shown, 9 images]
[im 5/36  brain]
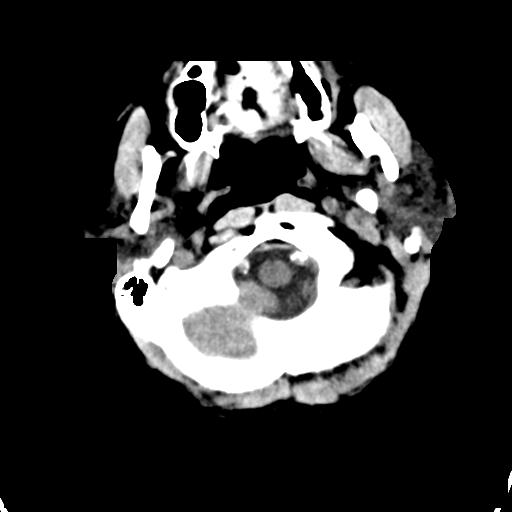
[im 5/36  bone]
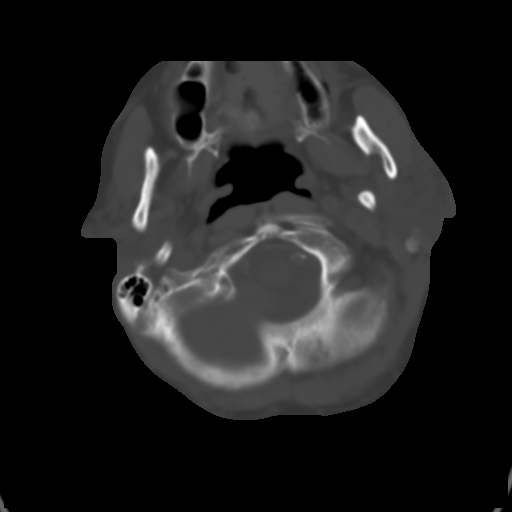
[im 9/36  brain]
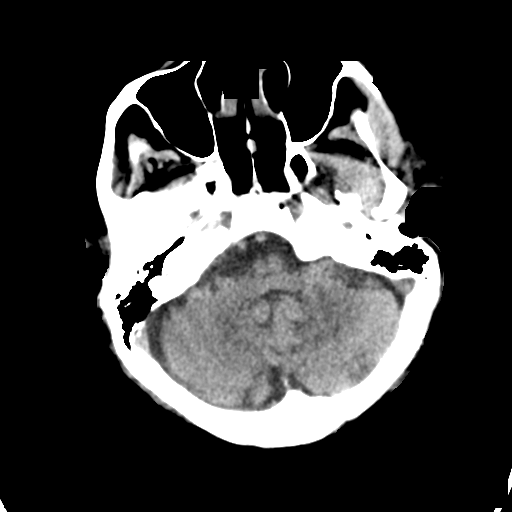
[im 14/36  brain]
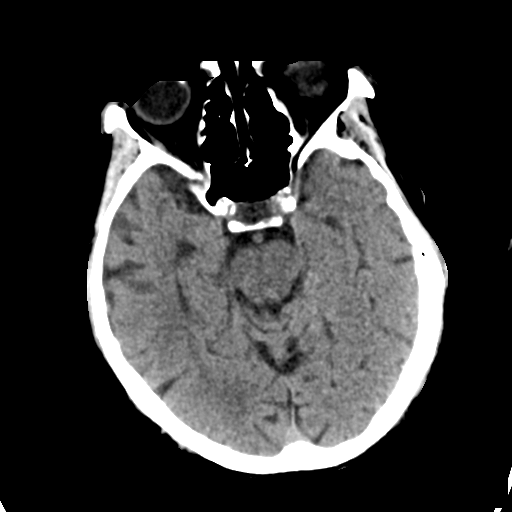
[im 18/36  brain]
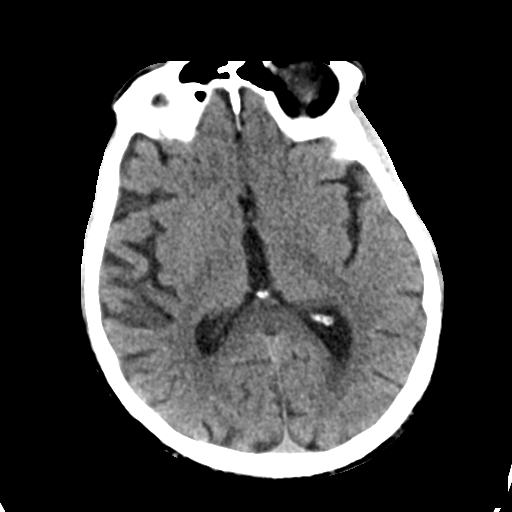
[im 22/36  brain]
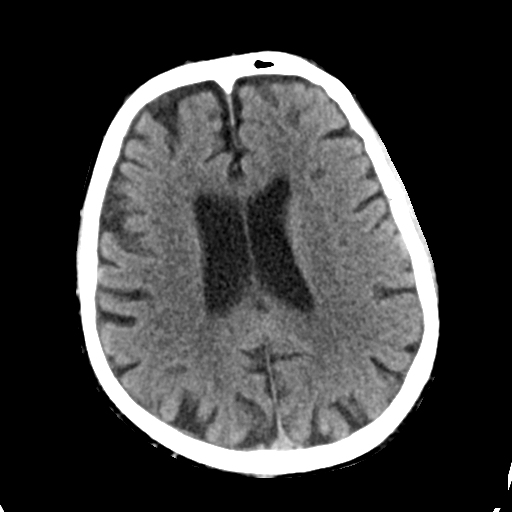
[im 22/36  bone]
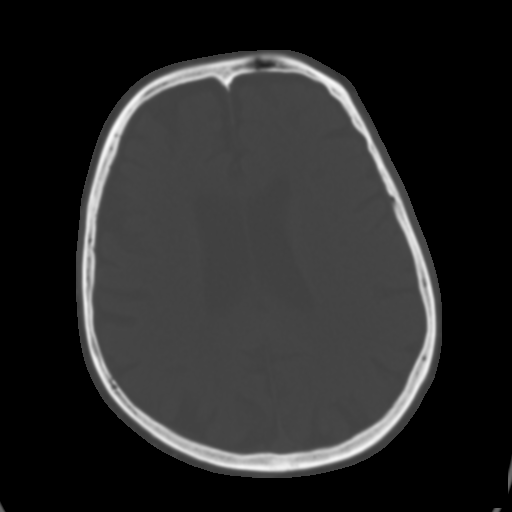
[im 27/36  brain]
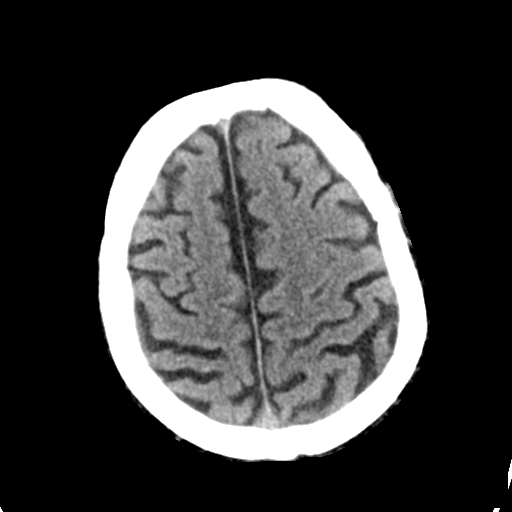
[im 31/36  brain]
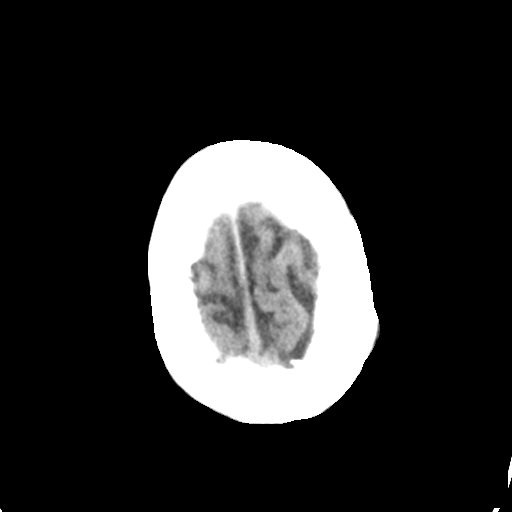

[Series 4: head bone · axial · 0.41mm/px · z∈[-118,-56]mm · 4 of 88 slices shown]
[im 9/88  bone]
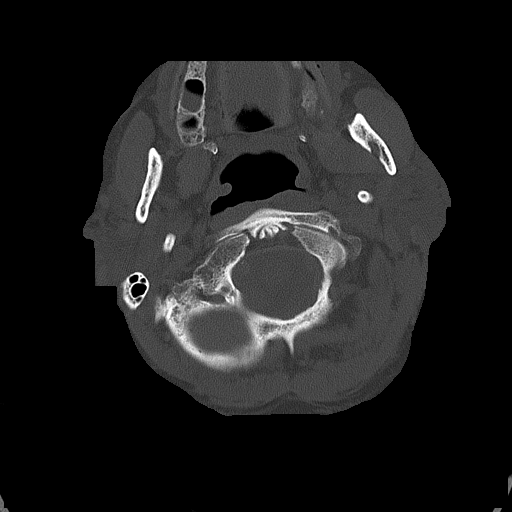
[im 18/88  bone]
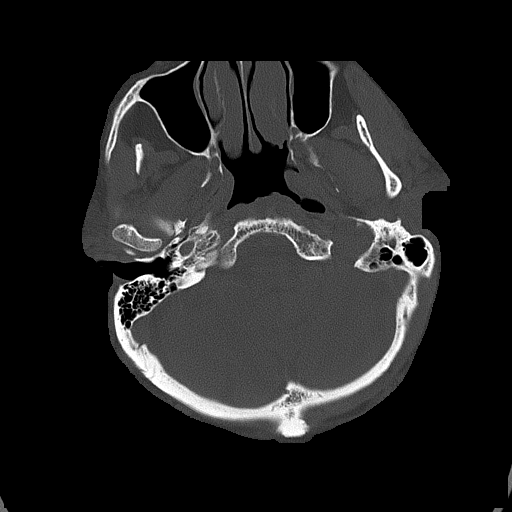
[im 27/88  bone]
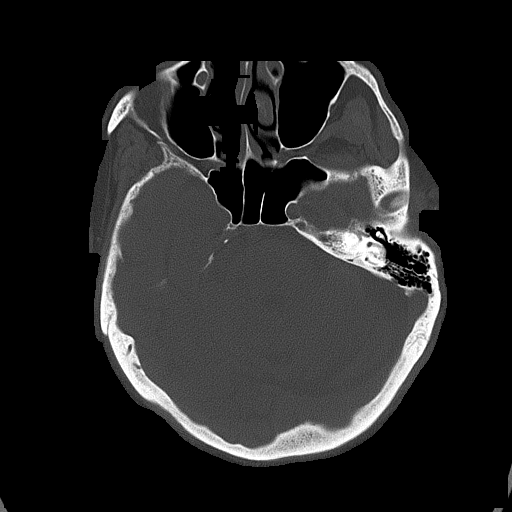
[im 40/88  bone]
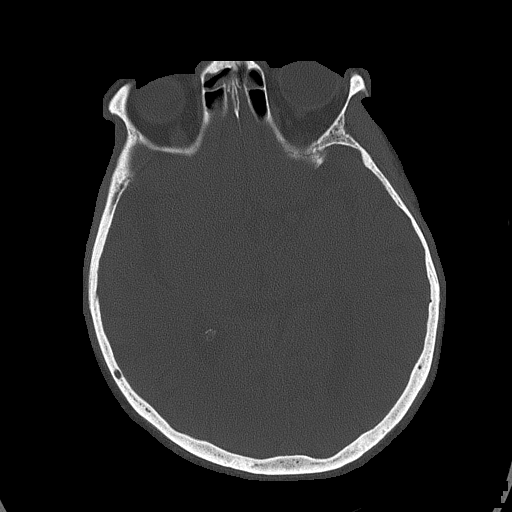

[Series 5: head without cor · coronal · non-contrast · 0.35mm/px · 3 of 72 slices shown]
[im 24/72  brain]
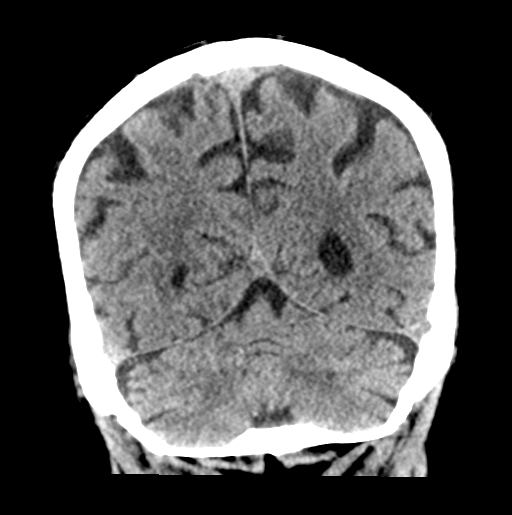
[im 32/72  brain]
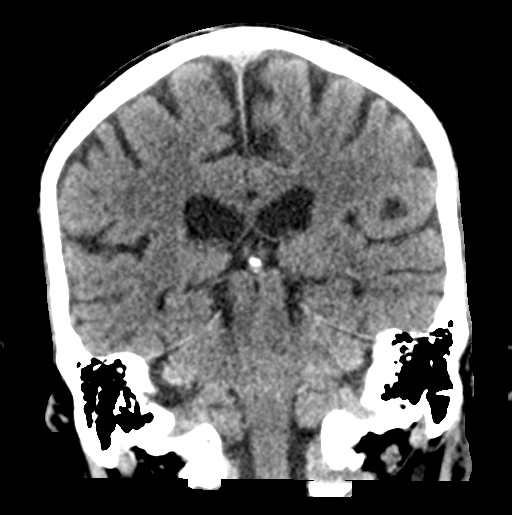
[im 40/72  brain]
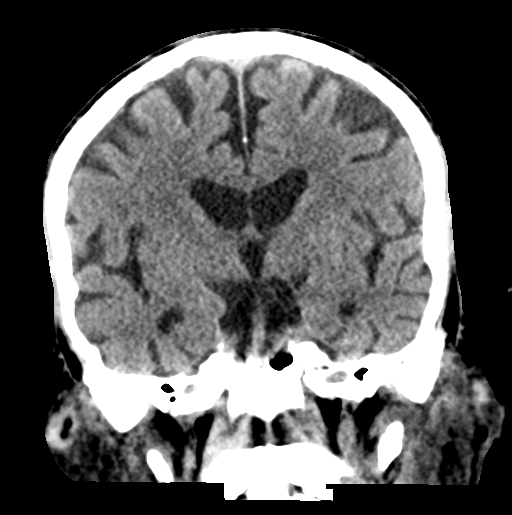

[Series 6: head without sag · sagittal · non-contrast · 0.36mm/px · 3 of 59 slices shown]
[im 20/59  brain]
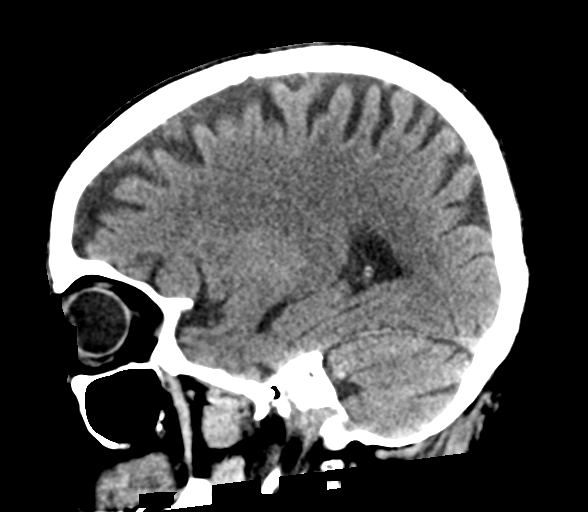
[im 30/59  brain]
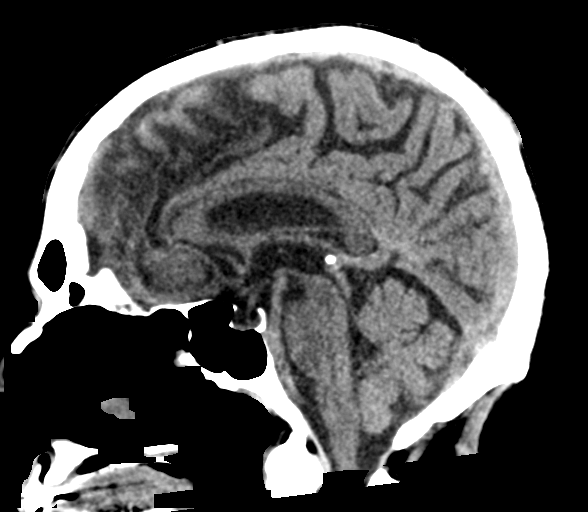
[im 39/59  brain]
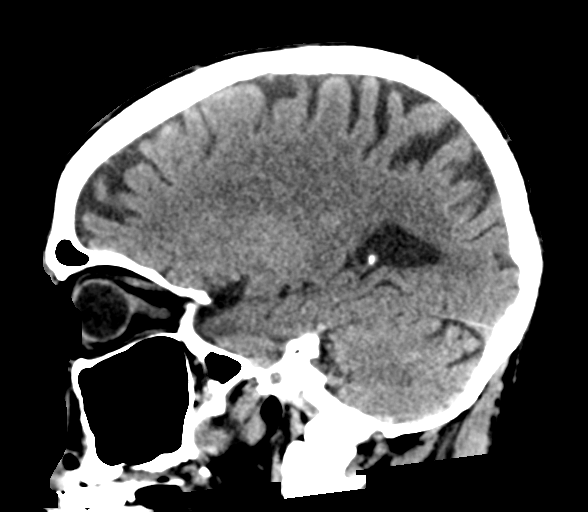

[17 of 47 positions shown; findings below may reference images not displayed]

FINDINGS: Brain: The brainstem, cerebellum, cerebral peduncles, thalami, basal
ganglia, basilar cisterns, and ventricular system appear within
normal limits. No intracranial hemorrhage, mass lesion, or acute
CVA.

Vascular: There is atherosclerotic calcification of the cavernous
carotid arteries bilaterally.

Skull: Unremarkable

Sinuses/Orbits: Mild chronic right maxillary and ethmoid sinusitis.

Other: No significant scalp hematoma.
IMPRESSION: 1. No acute intracranial findings.
2. Atherosclerosis.
3. Mild chronic right maxillary and ethmoid sinusitis.

## 2019-12-25 DIAGNOSIS — E538 Deficiency of other specified B group vitamins: Secondary | ICD-10-CM | POA: Diagnosis not present

## 2019-12-25 DIAGNOSIS — Z125 Encounter for screening for malignant neoplasm of prostate: Secondary | ICD-10-CM | POA: Diagnosis not present

## 2019-12-25 DIAGNOSIS — E7849 Other hyperlipidemia: Secondary | ICD-10-CM | POA: Diagnosis not present

## 2019-12-30 DIAGNOSIS — Z0000 Encounter for general adult medical examination without abnormal findings: Secondary | ICD-10-CM | POA: Diagnosis not present

## 2019-12-30 DIAGNOSIS — I700 Atherosclerosis of aorta: Secondary | ICD-10-CM | POA: Diagnosis not present

## 2019-12-30 DIAGNOSIS — D692 Other nonthrombocytopenic purpura: Secondary | ICD-10-CM | POA: Diagnosis not present

## 2019-12-30 DIAGNOSIS — R972 Elevated prostate specific antigen [PSA]: Secondary | ICD-10-CM | POA: Diagnosis not present

## 2019-12-30 DIAGNOSIS — I119 Hypertensive heart disease without heart failure: Secondary | ICD-10-CM | POA: Diagnosis not present

## 2019-12-30 DIAGNOSIS — E7849 Other hyperlipidemia: Secondary | ICD-10-CM | POA: Diagnosis not present

## 2019-12-30 DIAGNOSIS — H3530 Unspecified macular degeneration: Secondary | ICD-10-CM | POA: Diagnosis not present

## 2019-12-30 DIAGNOSIS — R82998 Other abnormal findings in urine: Secondary | ICD-10-CM | POA: Diagnosis not present

## 2019-12-30 DIAGNOSIS — I2510 Atherosclerotic heart disease of native coronary artery without angina pectoris: Secondary | ICD-10-CM | POA: Diagnosis not present

## 2019-12-30 DIAGNOSIS — Z1389 Encounter for screening for other disorder: Secondary | ICD-10-CM | POA: Diagnosis not present

## 2019-12-30 DIAGNOSIS — I6529 Occlusion and stenosis of unspecified carotid artery: Secondary | ICD-10-CM | POA: Diagnosis not present

## 2019-12-30 DIAGNOSIS — E538 Deficiency of other specified B group vitamins: Secondary | ICD-10-CM | POA: Diagnosis not present

## 2019-12-30 DIAGNOSIS — J3489 Other specified disorders of nose and nasal sinuses: Secondary | ICD-10-CM | POA: Diagnosis not present

## 2020-03-05 ENCOUNTER — Other Ambulatory Visit: Payer: Self-pay

## 2020-03-05 DIAGNOSIS — I6523 Occlusion and stenosis of bilateral carotid arteries: Secondary | ICD-10-CM

## 2020-03-23 ENCOUNTER — Other Ambulatory Visit: Payer: Self-pay

## 2020-03-23 ENCOUNTER — Encounter: Payer: Self-pay | Admitting: Vascular Surgery

## 2020-03-23 ENCOUNTER — Ambulatory Visit (INDEPENDENT_AMBULATORY_CARE_PROVIDER_SITE_OTHER): Payer: Medicare Other | Admitting: Vascular Surgery

## 2020-03-23 ENCOUNTER — Ambulatory Visit (HOSPITAL_COMMUNITY)
Admission: RE | Admit: 2020-03-23 | Discharge: 2020-03-23 | Disposition: A | Discharge status: Home / Self Care | Payer: Medicare Other | Source: Ambulatory Visit | Attending: Internal Medicine | Admitting: Internal Medicine

## 2020-03-23 VITALS — BP 122/64 | HR 60 | Temp 98.1°F | Resp 20 | Ht 69.0 in | Wt 173.0 lb

## 2020-03-23 DIAGNOSIS — I6523 Occlusion and stenosis of bilateral carotid arteries: Secondary | ICD-10-CM

## 2020-03-23 NOTE — Progress Notes (Signed)
[facility] [place]  Patient name: Brandon Roy MRN: [id number] DOB: 12/17/1933 Sex: male  REASON FOR VISIT: Follow-up left carotid endarterectomy for severe asymptomatic disease on 05/17/2018  HPI: Brandon Roy is a 86 y.o. male here today for follow-up.  Continues to do quite well.  Has had no neurologic deficits.  He reports that he has not received the Covid vaccine.  I urged him to reconsider this decision.  Explained that in excess of 90% of patients hospitalized currently for Covid illness are not vaccinated and 99% of deaths are nonvaccinated.  I encouraged him to wear a mask when he is out otherwise.  Past Medical History:  Diagnosis Date  . CAD (coronary artery disease)    Status post recent PTCA and stenting of the proximal LAD (3.82mmx23mm Promus stent, post dilated using a 3.75 [place] sprinter) and the distal LAD  . Carotid artery occlusion   . HTN (hypertension)   . Hyperlipidemia   . Peripheral arterial disease ([place])   . Pneumonia 1941  . Shingles   . Wears dentures   . Wears glasses     Family History  Problem Relation Age of Onset  . Heart attack Father   . Heart disease Father     SOCIAL HISTORY: Social History   Tobacco Use  . Smoking status: Former Smoker    Years: 2.00    Types: Cigarettes  . Smokeless tobacco: Never Used  . Tobacco comment: " Back in the 50's- 60's"  Substance Use Topics  . Alcohol use: Yes    Comment: 05/17/2018 "might have up to 5 drinks/year"    Allergies  Allergen Reactions  . Cinnamon Itching  . Lipitor [Atorvastatin Calcium] Other (See Comments)    Reaction not recalled- just "didn't work well with me"  . Valsartan-Hydrochlorothiazide Itching    The generic version (Valsartan-HCTZ) caused patient to itch  . Zocor [Simvastatin] Other (See Comments)    Reaction not recalled- just "didn't work well with me"    Current Outpatient Medications  Medication Sig Dispense  Refill  . Ascorbic Acid (VITAMIN C) 1000 MG tablet Take 1,000 mg by mouth daily.    Marland Kitchen aspirin 81 MG tablet Take 1 tablet (81 mg total) by mouth daily.    . cholecalciferol (VITAMIN D3) 25 MCG (1000 UNIT) tablet Take 1,000 Units by mouth daily.    . clopidogrel (PLAVIX) 75 MG tablet TAKE 1 TABLET (75 MG TOTAL) BY MOUTH DAILY. 90 tablet 2  . ezetimibe (ZETIA) 10 MG tablet TAKE 1 TABLET ONCE DAILY. 90 tablet 3  . fenofibrate 160 MG tablet TAKE 1 TABLET (160 MG TOTAL) BY MOUTH DAILY. 90 tablet 3  . hydrochlorothiazide (HYDRODIURIL) 12.5 MG tablet Take 12.5 mg by mouth daily.  2  . irbesartan (AVAPRO) 300 MG tablet Take 1 tablet by mouth daily.    . metoprolol tartrate (LOPRESSOR) 50 MG tablet Take 25 mg by mouth 2 (two) times daily.    . potassium chloride (KLOR-CON) 10 MEQ tablet Take 1 tablet (10 mEq total) by mouth daily. 90 tablet 3  . rosuvastatin (CRESTOR) 20 MG tablet Take 20 mg by mouth daily.    . valsartan-hydrochlorothiazide (DIOVAN-HCT) 320-12.5 MG tablet Take 1 tablet by mouth daily.    . vitamin B-12 (CYANOCOBALAMIN) 1000 MCG tablet Take 1,000 mcg by mouth at bedtime.      No current facility-administered medications for this visit.    REVIEW OF SYSTEMS:  [X]  denotes positive finding, [ ]   denotes negative finding Cardiac  Comments:  Chest pain or chest pressure:    Shortness of breath upon exertion:    Short of breath when lying flat:    Irregular heart rhythm:        Vascular    Pain in calf, thigh, or hip brought on by ambulation:    Pain in feet at night that wakes you up from your sleep:     Blood clot in your veins:    Leg swelling:           PHYSICAL EXAM: Vitals:   03/23/20 0848 03/23/20 0850  BP: 120/77 122/64  Pulse: 60   Resp: 20   Temp: 98.1 F (36.7 C)   SpO2: 98%   Weight: 173 lb (78.5 kg)   Height: 5\' 9"  (1.753 m)     GENERAL: The patient is a well-nourished male, in no acute distress. The vital signs are documented above. CARDIOVASCULAR:  Carotid arteries without bruits bilaterally.  2+ radial pulses PULMONARY: There is good air exchange  MUSCULOSKELETAL: There are no major deformities or cyanosis. NEUROLOGIC: No focal weakness or paresthesias are detected. SKIN: There are no ulcers or rashes noted. PSYCHIATRIC: The patient has a normal affect.  DATA:  Carotid duplex reveals widely patent endarterectomy with no evidence of restenosis.  Right carotid is known to be occluded  MEDICAL ISSUES: Stable overall.  We will continue full activities.  We will see [place] again in 1 year with repeat carotid duplex    Rosetta Posner, MD [facility] Tel [phone] Pager [phone]

## 2020-06-07 ENCOUNTER — Encounter: Payer: Self-pay | Admitting: Cardiovascular Disease

## 2020-06-07 NOTE — Progress Notes (Signed)
Brandon Roy Date of Birth  Jun 25, 1934       [facility]    [employer] [address], [place], [place]  [address]    [place], [place]  [address] [phone]     [phone]   Fax  [phone]     Fax [phone]  Problem List: 1. Coronary artery disease-status post PTCA and stenting of his proximal LAD using a 3.5 x 23 mm and distal LAD stent using a 2.25 mm Taxus stent. [name] also status post stenting of the right coronary artery using a 3.0 x 15 mm Promus stent dilated with a 3.5 mm noncompliant balloon. 2. Hypertension 3. Hyperlipidemia   Brandon Roy is a 86 year old gentleman with a history of coronary artery disease. Status post PTCA and stenting of his proximal left anterior descending artery using a 3.5 mm x 23 mm stent. He also has a stent in his distal left anterior descending artery-2.25 mm Taxus stent) she's.  He is also status post PTCA and stenting of the right coronary artery using a 3.0 x 15 mm Primus stent post dilated using a 3.5 mm noncompliant balloon.  He also has a history of hypertension and hyperlipidemia. His pharmacy changed him to generic Diovan/HCT. Following this he developed an itchy rash. He went back to the Brand name Diovan and feels much better.  January 02, 2013:  Brandon Roy is doing well.  No CP.  Staying busy.    01/05/2014:  Brandon Roy is doing ok.  Able to do all of his normal activities. [name] not having any chest pain.  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    February 04, 2016:  Staying busy.  No angina   Aug. 27, 2018:  Doing well. Staying active.   Going camping with grandson. Lipids are managed by Dr. Virgina Jock.    Oct. 2, 2019: Doing well.    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   Nov. 9, 2020   Brandon Roy is seen today for follow up of his CAD Found to have severe carotid disease.   Now s/p L CEA . Has severe right carotid  disease also .   No CP   Nov. 8, 2021: Brandon Roy is seen today for follow-up of his coronary artery disease.  He has severe carotid artery disease. He has an occluded right carotid artery.  He status post left carotid endarterectomy.  [facility]  Has not had covid vaccine We discussed this  I think he should get the vaccine .   Labs from his primary medical doctor reveals a total cholesterol of 106 Triglyceride level is 61 HDL is 38 LDL is 56  Glucose level is 91, electrolytes are within normal limits.  White blood cell count is normal.  Hemoglobin is 13.9. PSA is 2.1.  Current Outpatient Medications on File Prior to Visit  Medication Sig Dispense Refill  . Ascorbic Acid (VITAMIN C) 1000 MG tablet Take 1,000 mg by mouth daily.    Marland Kitchen aspirin 81 MG tablet Take 1 tablet (81 mg total) by mouth daily.    . cholecalciferol (VITAMIN D3) 25 MCG (1000 UNIT) tablet Take 1,000 Units by mouth daily.    . clopidogrel (PLAVIX) 75 MG tablet TAKE 1 TABLET (75 MG TOTAL) BY MOUTH DAILY. 90 tablet 2  . ezetimibe (ZETIA)  10 MG tablet TAKE 1 TABLET ONCE DAILY. 90 tablet 3  . fenofibrate 160 MG tablet TAKE 1 TABLET (160 MG TOTAL) BY MOUTH DAILY. 90 tablet 3  . hydrochlorothiazide (HYDRODIURIL) 12.5 MG tablet Take 12.5 mg by mouth daily.  2  . irbesartan (AVAPRO) 300 MG tablet Take 1 tablet by mouth daily.    . metoprolol tartrate (LOPRESSOR) 50 MG tablet Take 25 mg by mouth 2 (two) times daily.    . potassium chloride (KLOR-CON) 10 MEQ tablet Take 1 tablet (10 mEq total) by mouth daily. 90 tablet 3  . rosuvastatin (CRESTOR) 20 MG tablet Take 20 mg by mouth daily.    . vitamin B-12 (CYANOCOBALAMIN) 1000 MCG tablet Take 1,000 mcg by mouth at bedtime.      No current facility-administered medications on file prior to visit.    Allergies  Allergen Reactions  . Cinnamon Itching  . Lipitor [Atorvastatin Calcium] Other (See Comments)    Reaction not recalled- just  "didn't work well with me"  . Valsartan-Hydrochlorothiazide Itching    The generic version (Valsartan-HCTZ) caused patient to itch  . Zocor [Simvastatin] Other (See Comments)    Reaction not recalled- just "didn't work well with me"    Past Medical History:  Diagnosis Date  . CAD (coronary artery disease)    Status post recent PTCA and stenting of the proximal LAD (3.65mmx23mm Promus stent, post dilated using a 3.75 [place] sprinter) and the distal LAD  . Carotid artery occlusion   . HTN (hypertension)   . Hyperlipidemia   . Peripheral arterial disease ([place])   . Pneumonia 1941  . Shingles   . Wears dentures   . Wears glasses     Past Surgical History:  Procedure Laterality Date  . CARDIAC CATHETERIZATION  2000-2011   "6 caths and 3 stents total" (05/17/2018)  . CAROTID ENDARTERECTOMY Left 05/17/2018  . CATARACT EXTRACTION W/ INTRAOCULAR LENS  IMPLANT, BILATERAL Bilateral   . COLONOSCOPY    . CORONARY ANGIOPLASTY WITH STENT PLACEMENT  Sep 24, 1998  . CORONARY ANGIOPLASTY WITH STENT PLACEMENT     Status post and stenting of his left circumflex artery   . ENDARTERECTOMY Left 05/17/2018   Procedure: EXPLORATION OF NECK;  Surgeon: Serafina Mitchell, MD;  Location: [facility];  Service: Vascular;  Laterality: Left;  . ENDARTERECTOMY Left 05/17/2018   Procedure: LEFT ENDARTERECTOMY CAROTID;  Surgeon: Rosetta Posner, MD;  Location: [facility];  Service: Vascular;  Laterality: Left;  . EYE SURGERY    . HEMATOMA EVACUATION Left 05/17/2018   S/P carotid endarterectomy  . MULTIPLE TOOTH EXTRACTIONS    . PATCH ANGIOPLASTY Left 05/17/2018   Procedure: PATCH ANGIOPLASTY USING HEMASHIELD PLATINUM FINESSE;  Surgeon: Rosetta Posner, MD;  Location: [facility];  Service: Vascular;  Laterality: Left;  . TONSILLECTOMY      Social History   Tobacco Use  Smoking Status Former Smoker  . Years: 2.00  . Types: Cigarettes  Smokeless Tobacco Never Used  Tobacco Comment   " Back in the 50's- 60's"    Social History    Substance and Sexual Activity  Alcohol Use Yes   Comment: 05/17/2018 "might have up to 5 drinks/year"    Family History  Problem Relation Age of Onset  . Heart attack Father   . Heart disease Father     Reviw of Systems:  Reviewed in the HPI.  All other systems are negative.  Physical Exam: Blood pressure (!) 98/56, pulse 63, height 5\' 9"  (  1.753 m), weight 172 lb 6.4 oz (78.2 kg), SpO2 98 %.  GEN:  Well nourished, well developed in no acute distress HEENT: Normal NECK: No JVD; No carotid bruits LYMPHATICS: No lymphadenopathy CARDIAC: RRR , no murmurs, rubs, gallops RESPIRATORY:  Clear to auscultation without rales, wheezing or rhonchi  ABDOMEN: Soft, non-tender, non-distended MUSCULOSKELETAL:  No edema; No deformity  SKIN: Warm and dry NEUROLOGIC:  Alert and oriented x 3     ECG:       Assessment / Plan:    1. Coronary artery disease-status post PTCA and stenting of his proximal LAD using a 3.5 x 23 mm and distal LAD stent using a 2.25 mm Taxus stent. [name] also status post stenting of the right coronary artery using a 3.0 x 15 mm Promus stent dilated with a 3.5 mm noncompliant balloon. No angina  Seems to be doing well   2. Hypertension -   BP is well controlled.    3. Hyperlipidemia -   Labs from Primary MD are stable    4.   Carotid artery disease. :  Managed by Dr. Laurina Bustle, MD  06/08/2020 9:54 AM    [place],  [place], [place]  [address] Pager [phone] Phone: [phone]; Fax: [phone]

## 2020-06-08 ENCOUNTER — Encounter: Payer: Self-pay | Admitting: Cardiovascular Disease

## 2020-06-08 ENCOUNTER — Other Ambulatory Visit: Payer: Self-pay

## 2020-06-08 ENCOUNTER — Ambulatory Visit (INDEPENDENT_AMBULATORY_CARE_PROVIDER_SITE_OTHER): Payer: Medicare Other | Admitting: Cardiovascular Disease

## 2020-06-08 VITALS — BP 98/56 | HR 63 | Ht 69.0 in | Wt 172.4 lb

## 2020-06-08 DIAGNOSIS — I2510 Atherosclerotic heart disease of native coronary artery without angina pectoris: Secondary | ICD-10-CM

## 2020-06-08 DIAGNOSIS — I10 Essential (primary) hypertension: Secondary | ICD-10-CM | POA: Diagnosis not present

## 2020-06-08 DIAGNOSIS — I6523 Occlusion and stenosis of bilateral carotid arteries: Secondary | ICD-10-CM | POA: Diagnosis not present

## 2020-06-08 NOTE — Patient Instructions (Signed)

## 2020-08-18 ENCOUNTER — Other Ambulatory Visit: Payer: Self-pay | Admitting: Cardiovascular Disease

## 2020-10-21 DIAGNOSIS — H52203 Unspecified astigmatism, bilateral: Secondary | ICD-10-CM | POA: Diagnosis not present

## 2020-10-21 DIAGNOSIS — H524 Presbyopia: Secondary | ICD-10-CM | POA: Diagnosis not present

## 2020-10-21 DIAGNOSIS — Z961 Presence of intraocular lens: Secondary | ICD-10-CM | POA: Diagnosis not present

## 2020-10-21 DIAGNOSIS — H35373 Puckering of macula, bilateral: Secondary | ICD-10-CM | POA: Diagnosis not present

## 2020-11-10 MED ORDER — EZETIMIBE 10 MG PO TABS: 10.0000 mg | ORAL_TABLET | Freq: Every day | ORAL | 3 refills | Status: DC

## 2020-12-21 ENCOUNTER — Other Ambulatory Visit: Payer: Self-pay

## 2020-12-21 DIAGNOSIS — I6523 Occlusion and stenosis of bilateral carotid arteries: Secondary | ICD-10-CM

## 2020-12-24 DIAGNOSIS — Z125 Encounter for screening for malignant neoplasm of prostate: Secondary | ICD-10-CM | POA: Diagnosis not present

## 2020-12-24 DIAGNOSIS — Z0000 Encounter for general adult medical examination without abnormal findings: Secondary | ICD-10-CM | POA: Diagnosis not present

## 2020-12-24 DIAGNOSIS — E785 Hyperlipidemia, unspecified: Secondary | ICD-10-CM | POA: Diagnosis not present

## 2020-12-24 DIAGNOSIS — E538 Deficiency of other specified B group vitamins: Secondary | ICD-10-CM | POA: Diagnosis not present

## 2020-12-31 DIAGNOSIS — I700 Atherosclerosis of aorta: Secondary | ICD-10-CM | POA: Diagnosis not present

## 2020-12-31 DIAGNOSIS — Z1331 Encounter for screening for depression: Secondary | ICD-10-CM | POA: Diagnosis not present

## 2020-12-31 DIAGNOSIS — D692 Other nonthrombocytopenic purpura: Secondary | ICD-10-CM | POA: Diagnosis not present

## 2020-12-31 DIAGNOSIS — Z0000 Encounter for general adult medical examination without abnormal findings: Secondary | ICD-10-CM | POA: Diagnosis not present

## 2020-12-31 DIAGNOSIS — I6529 Occlusion and stenosis of unspecified carotid artery: Secondary | ICD-10-CM | POA: Diagnosis not present

## 2020-12-31 DIAGNOSIS — E785 Hyperlipidemia, unspecified: Secondary | ICD-10-CM | POA: Diagnosis not present

## 2020-12-31 DIAGNOSIS — I2510 Atherosclerotic heart disease of native coronary artery without angina pectoris: Secondary | ICD-10-CM | POA: Diagnosis not present

## 2020-12-31 DIAGNOSIS — R972 Elevated prostate specific antigen [PSA]: Secondary | ICD-10-CM | POA: Diagnosis not present

## 2020-12-31 DIAGNOSIS — Z1339 Encounter for screening examination for other mental health and behavioral disorders: Secondary | ICD-10-CM | POA: Diagnosis not present

## 2020-12-31 DIAGNOSIS — R82998 Other abnormal findings in urine: Secondary | ICD-10-CM | POA: Diagnosis not present

## 2020-12-31 DIAGNOSIS — I119 Hypertensive heart disease without heart failure: Secondary | ICD-10-CM | POA: Diagnosis not present

## 2020-12-31 DIAGNOSIS — I8393 Asymptomatic varicose veins of bilateral lower extremities: Secondary | ICD-10-CM | POA: Diagnosis not present

## 2020-12-31 DIAGNOSIS — E538 Deficiency of other specified B group vitamins: Secondary | ICD-10-CM | POA: Diagnosis not present

## 2021-06-02 ENCOUNTER — Encounter: Payer: Self-pay | Admitting: Cardiovascular Disease

## 2021-06-02 NOTE — Progress Notes (Signed)
Brandon Roy Date of Birth  04-16-1934       [facility]    [employer] [address], [place], [place]  [address]    [place], [place]  [address] [phone]     [phone]   Fax  [phone]     Fax [phone]  Problem List: 1. Coronary artery disease-status post PTCA and stenting of his proximal LAD using a 3.5 x 23 mm and distal LAD stent using a 2.25 mm Taxus stent. [name] also status post stenting of the right coronary artery using a 3.0 x 15 mm Promus stent dilated with a 3.5 mm noncompliant balloon. 2. Hypertension 3. Hyperlipidemia   Brandon Roy is a 87 year old gentleman with a history of coronary artery disease. Status post PTCA and stenting of his proximal left anterior descending artery using a 3.5 mm x 23 mm stent. He also has a stent in his distal left anterior descending artery-2.25 mm Taxus stent) she's.  He is also status post PTCA and stenting of the right coronary artery using a 3.0 x 15 mm Primus stent post dilated using a 3.5 mm noncompliant balloon.  He also has a history of hypertension and hyperlipidemia. His pharmacy changed him to generic Diovan/HCT. Following this he developed an itchy rash. He went back to the Brand name Diovan and feels much better.  January 02, 2013:  Brandon Roy is doing well.  No CP.  Staying busy.    01/05/2014:  Brandon Roy is doing ok.  Able to do all of his normal activities. [name] not having any chest pain.  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    February 04, 2016:  Staying busy.  No angina   Aug. 27, 2018:  Doing well. Staying active.   Going camping with grandson. Lipids are managed by Dr. Virgina Jock.    Oct. 2, 2019: Doing well.    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   Nov. 9, 2020   Brandon Roy is seen today for follow up of his CAD Found to have severe carotid disease.   Now s/p L CEA . Has severe right carotid  disease also .   No CP   Nov. 8, 2021: Brandon Roy is seen today for follow-up of his coronary artery disease.  He has severe carotid artery disease. He has an occluded right carotid artery.  He status post left carotid endarterectomy.  [facility]  Has not had covid vaccine We discussed this  I think he should get the vaccine .   Labs from his primary medical doctor reveals a total cholesterol of 106 Triglyceride level is 61 HDL is 38 LDL is 56  Glucose level is 91, electrolytes are within normal limits.  White blood cell count is normal.  Hemoglobin is 13.9. PSA is 2.1.  Nov. 4, 2022 Brandon Roy is seen today for follow up of his CAD, Carotid artery disease, Still very active.  Lipid levels from Dr. Keane Police office from May, 2022 look good.  His LDL is well controlled.    Current Outpatient Medications on File Prior to Visit  Medication Sig Dispense Refill   Ascorbic Acid (VITAMIN C) 1000 MG tablet Take 1,000 mg by mouth daily.     aspirin 81 MG tablet Take 1 tablet (81 mg total) by mouth daily.  cholecalciferol (VITAMIN D3) 25 MCG (1000 UNIT) tablet Take 1,000 Units by mouth daily.     clopidogrel (PLAVIX) 75 MG tablet TAKE 1 TABLET (75 MG TOTAL) BY MOUTH DAILY. 90 tablet 2   ezetimibe (ZETIA) 10 MG tablet Take 1 tablet (10 mg total) by mouth daily. 90 tablet 3   fenofibrate 160 MG tablet TAKE 1 TABLET (160 MG TOTAL) BY MOUTH DAILY. 90 tablet 3   hydrochlorothiazide (HYDRODIURIL) 12.5 MG tablet Take 12.5 mg by mouth daily.  2   irbesartan (AVAPRO) 300 MG tablet Take 1 tablet by mouth daily.     metoprolol tartrate (LOPRESSOR) 50 MG tablet Take 25 mg by mouth 2 (two) times daily.     potassium chloride (KLOR-CON) 10 MEQ tablet Take 1 tablet (10 mEq total) by mouth daily. 90 tablet 3   rosuvastatin (CRESTOR) 20 MG tablet Take 20 mg by mouth daily.     vitamin B-12 (CYANOCOBALAMIN) 1000 MCG tablet Take 1,000 mcg by mouth at bedtime.      zinc  gluconate 50 MG tablet Take 50 mg by mouth daily.     No current facility-administered medications on file prior to visit.    Allergies  Allergen Reactions   Cinnamon Itching   Lipitor [Atorvastatin Calcium] Other (See Comments)    Reaction not recalled- just "didn't work well with me"   Valsartan-Hydrochlorothiazide Itching    The generic version (Valsartan-HCTZ) caused patient to itch   Zocor [Simvastatin] Other (See Comments)    Reaction not recalled- just "didn't work well with me"    Past Medical History:  Diagnosis Date   CAD (coronary artery disease)    Status post recent PTCA and stenting of the proximal LAD (3.42mmx23mm Promus stent, post dilated using a 3.75 [place] sprinter) and the distal LAD   Carotid artery occlusion    HTN (hypertension)    Hyperlipidemia    Peripheral arterial disease ([place])    Pneumonia 1941   Shingles    Wears dentures    Wears glasses     Past Surgical History:  Procedure Laterality Date   CARDIAC CATHETERIZATION  2000-2011   "6 caths and 3 stents total" (05/17/2018)   CAROTID ENDARTERECTOMY Left 05/17/2018   CATARACT EXTRACTION W/ INTRAOCULAR LENS  IMPLANT, BILATERAL Bilateral    COLONOSCOPY     CORONARY ANGIOPLASTY WITH STENT PLACEMENT  Sep 24, 1998   CORONARY ANGIOPLASTY WITH STENT PLACEMENT     Status post and stenting of his left circumflex artery    ENDARTERECTOMY Left 05/17/2018   Procedure: EXPLORATION OF NECK;  Surgeon: Serafina Mitchell, MD;  Location: [facility];  Service: Vascular;  Laterality: Left;   ENDARTERECTOMY Left 05/17/2018   Procedure: LEFT ENDARTERECTOMY CAROTID;  Surgeon: Rosetta Posner, MD;  [facility];  Service: Vascular;  Laterality: Left;   EYE SURGERY     HEMATOMA EVACUATION Left 05/17/2018   S/P carotid endarterectomy   MULTIPLE TOOTH EXTRACTIONS     PATCH ANGIOPLASTY Left 05/17/2018   Procedure: PATCH ANGIOPLASTY USING HEMASHIELD PLATINUM FINESSE;  Surgeon: Rosetta Posner, MD;  [facility];  Service:  Vascular;  Laterality: Left;   TONSILLECTOMY      Social History   Tobacco Use  Smoking Status Former   Years: 2.00   Types: Cigarettes  Smokeless Tobacco Never  Tobacco Comments   " Back in the 50's- 60's"    Social History   Substance and Sexual Activity  Alcohol Use Yes   Comment: 05/17/2018 "might have  up to 5 drinks/year"    Family History  Problem Relation Age of Onset   Heart attack Father    Heart disease Father     Reviw of Systems:  Reviewed in the HPI.  All other systems are negative.   Physical Exam: Blood pressure 108/70, pulse 69, height 5\' 9"  (1.753 m), weight 173 lb (78.5 kg), SpO2 97 %.  GEN:  Well nourished, well developed in no acute distress HEENT: Normal NECK: No JVD; No carotid bruits LYMPHATICS: No lymphadenopathy CARDIAC: RRR , no murmurs, rubs, gallops RESPIRATORY:  Clear to auscultation without rales, wheezing or rhonchi  ABDOMEN: Soft, non-tender, non-distended MUSCULOSKELETAL:  No edema; No deformity  SKIN: Warm and dry NEUROLOGIC:  Alert and oriented x 3      ECG:   June 03, 2021: Normal sinus rhythm at 69.  First-degree AV block.  No ST or T wave changes.    Assessment / Plan:    1. Coronary artery disease-status post PTCA and stenting of his proximal LAD using a 3.5 x 23 mm and distal LAD stent using a 2.25 mm Taxus stent. [name] also status post stenting of the right coronary artery using a 3.0 x 15 mm Promus stent dilated with a 3.5 mm noncompliant balloon. Remains very active.  He is not having any episodes of chest pain or shortness of breath.   2. Hypertension -   blood pressure is very well controlled.    3. Hyperlipidemia -    Lipid levels are well controlled.  His last levels from Dr. Keane Police office look great.   4.   Carotid artery disease. :   We will see him again in 1 year.  Mertie Moores, MD  06/03/2021 9:57 AM    [place] [address],  [place], [place]   [address] Pager [phone] Phone: [phone]; Fax: [phone]

## 2021-06-03 ENCOUNTER — Ambulatory Visit (INDEPENDENT_AMBULATORY_CARE_PROVIDER_SITE_OTHER): Payer: Medicare Other | Admitting: Cardiovascular Disease

## 2021-06-03 ENCOUNTER — Encounter: Payer: Self-pay | Admitting: Cardiovascular Disease

## 2021-06-03 ENCOUNTER — Other Ambulatory Visit: Payer: Self-pay

## 2021-06-03 VITALS — BP 108/70 | HR 69 | Ht 69.0 in | Wt 173.0 lb

## 2021-06-03 DIAGNOSIS — E785 Hyperlipidemia, unspecified: Secondary | ICD-10-CM | POA: Diagnosis not present

## 2021-06-03 DIAGNOSIS — I6523 Occlusion and stenosis of bilateral carotid arteries: Secondary | ICD-10-CM

## 2021-06-03 DIAGNOSIS — I2510 Atherosclerotic heart disease of native coronary artery without angina pectoris: Secondary | ICD-10-CM | POA: Diagnosis not present

## 2021-06-03 NOTE — Patient Instructions (Signed)
Medication Instructions:  ° °Your physician recommends that you continue on your current medications as directed. Please refer to the Current Medication list given to you today. ° °*If you need a refill on your cardiac medications before your next appointment, please call your pharmacy* ° ° ° °Follow-Up: °At [facility], you and your health needs are our priority.  As part of our continuing mission to provide you with exceptional heart care, we have created designated Provider Care Teams.  These Care Teams include your primary Cardiologist (physician) and Advanced Practice Providers (APPs -  Physician Assistants and Nurse Practitioners) who all work together to provide you with the care you need, when you need it. ° °We recommend signing up for the patient portal called "MyChart".  Sign up information is provided on this After Visit Summary.  MyChart is used to connect with patients for Virtual Visits (Telemedicine).  Patients are able to view lab/test results, encounter notes, upcoming appointments, etc.  Non-urgent messages can be sent to your provider as well.   °To learn more about what you can do with MyChart, go to [email or web address].   ° °Your next appointment:   °1 year(s) ° °The format for your next appointment:   °In Person ° °Provider:   °Philip Nahser, MD { ° ° °

## 2021-11-18 ENCOUNTER — Other Ambulatory Visit: Payer: Self-pay | Admitting: Cardiovascular Disease

## 2021-12-29 DIAGNOSIS — E538 Deficiency of other specified B group vitamins: Secondary | ICD-10-CM | POA: Diagnosis not present

## 2021-12-29 DIAGNOSIS — I10 Essential (primary) hypertension: Secondary | ICD-10-CM | POA: Diagnosis not present

## 2021-12-29 DIAGNOSIS — E785 Hyperlipidemia, unspecified: Secondary | ICD-10-CM | POA: Diagnosis not present

## 2021-12-29 DIAGNOSIS — R7989 Other specified abnormal findings of blood chemistry: Secondary | ICD-10-CM | POA: Diagnosis not present

## 2021-12-29 DIAGNOSIS — Z125 Encounter for screening for malignant neoplasm of prostate: Secondary | ICD-10-CM | POA: Diagnosis not present

## 2022-01-05 DIAGNOSIS — R972 Elevated prostate specific antigen [PSA]: Secondary | ICD-10-CM | POA: Diagnosis not present

## 2022-01-05 DIAGNOSIS — Z1331 Encounter for screening for depression: Secondary | ICD-10-CM | POA: Diagnosis not present

## 2022-01-05 DIAGNOSIS — I700 Atherosclerosis of aorta: Secondary | ICD-10-CM | POA: Diagnosis not present

## 2022-01-05 DIAGNOSIS — D692 Other nonthrombocytopenic purpura: Secondary | ICD-10-CM | POA: Diagnosis not present

## 2022-01-05 DIAGNOSIS — E538 Deficiency of other specified B group vitamins: Secondary | ICD-10-CM | POA: Diagnosis not present

## 2022-01-05 DIAGNOSIS — I119 Hypertensive heart disease without heart failure: Secondary | ICD-10-CM | POA: Diagnosis not present

## 2022-01-05 DIAGNOSIS — I6529 Occlusion and stenosis of unspecified carotid artery: Secondary | ICD-10-CM | POA: Diagnosis not present

## 2022-01-05 DIAGNOSIS — R82998 Other abnormal findings in urine: Secondary | ICD-10-CM | POA: Diagnosis not present

## 2022-01-05 DIAGNOSIS — I10 Essential (primary) hypertension: Secondary | ICD-10-CM | POA: Diagnosis not present

## 2022-01-05 DIAGNOSIS — Z1339 Encounter for screening examination for other mental health and behavioral disorders: Secondary | ICD-10-CM | POA: Diagnosis not present

## 2022-01-05 DIAGNOSIS — M1990 Unspecified osteoarthritis, unspecified site: Secondary | ICD-10-CM | POA: Diagnosis not present

## 2022-01-05 DIAGNOSIS — E785 Hyperlipidemia, unspecified: Secondary | ICD-10-CM | POA: Diagnosis not present

## 2022-01-05 DIAGNOSIS — Z0000 Encounter for general adult medical examination without abnormal findings: Secondary | ICD-10-CM | POA: Diagnosis not present

## 2022-01-05 DIAGNOSIS — I2510 Atherosclerotic heart disease of native coronary artery without angina pectoris: Secondary | ICD-10-CM | POA: Diagnosis not present

## 2022-03-23 ENCOUNTER — Other Ambulatory Visit (HOSPITAL_COMMUNITY): Payer: Self-pay | Admitting: Internal Medicine

## 2022-03-23 ENCOUNTER — Ambulatory Visit (HOSPITAL_COMMUNITY)
Admission: RE | Admit: 2022-03-23 | Discharge: 2022-03-23 | Disposition: A | Discharge status: Home / Self Care | Payer: Medicare Other | Source: Ambulatory Visit | Attending: Vascular Surgery | Admitting: Vascular Surgery

## 2022-03-23 DIAGNOSIS — I6529 Occlusion and stenosis of unspecified carotid artery: Secondary | ICD-10-CM

## 2022-06-05 ENCOUNTER — Encounter: Payer: Self-pay | Admitting: Cardiovascular Disease

## 2022-06-05 NOTE — Progress Notes (Signed)
This encounter was created in error - please disregard.

## 2022-06-06 ENCOUNTER — Encounter: Payer: Medicare Other | Admitting: Cardiovascular Disease

## 2022-07-22 ENCOUNTER — Other Ambulatory Visit: Payer: Self-pay | Admitting: Cardiovascular Disease

## 2022-08-01 DIAGNOSIS — R972 Elevated prostate specific antigen [PSA]: Secondary | ICD-10-CM | POA: Diagnosis not present

## 2022-08-01 DIAGNOSIS — D692 Other nonthrombocytopenic purpura: Secondary | ICD-10-CM | POA: Diagnosis not present

## 2022-08-01 DIAGNOSIS — E538 Deficiency of other specified B group vitamins: Secondary | ICD-10-CM | POA: Diagnosis not present

## 2022-08-01 DIAGNOSIS — H3530 Unspecified macular degeneration: Secondary | ICD-10-CM | POA: Diagnosis not present

## 2022-08-01 DIAGNOSIS — I8393 Asymptomatic varicose veins of bilateral lower extremities: Secondary | ICD-10-CM | POA: Diagnosis not present

## 2022-08-01 DIAGNOSIS — I119 Hypertensive heart disease without heart failure: Secondary | ICD-10-CM | POA: Diagnosis not present

## 2022-08-01 DIAGNOSIS — I700 Atherosclerosis of aorta: Secondary | ICD-10-CM | POA: Diagnosis not present

## 2022-08-01 DIAGNOSIS — I2510 Atherosclerotic heart disease of native coronary artery without angina pectoris: Secondary | ICD-10-CM | POA: Diagnosis not present

## 2022-08-01 DIAGNOSIS — I6529 Occlusion and stenosis of unspecified carotid artery: Secondary | ICD-10-CM | POA: Diagnosis not present

## 2022-08-01 DIAGNOSIS — M545 Low back pain, unspecified: Secondary | ICD-10-CM | POA: Diagnosis not present

## 2022-08-01 DIAGNOSIS — M1990 Unspecified osteoarthritis, unspecified site: Secondary | ICD-10-CM | POA: Diagnosis not present

## 2022-08-01 DIAGNOSIS — E785 Hyperlipidemia, unspecified: Secondary | ICD-10-CM | POA: Diagnosis not present

## 2022-08-30 ENCOUNTER — Encounter: Payer: Self-pay | Admitting: Cardiovascular Disease

## 2022-08-30 ENCOUNTER — Ambulatory Visit: Payer: Medicare Other | Attending: Cardiovascular Disease | Admitting: Cardiovascular Disease

## 2022-08-30 VITALS — BP 128/50 | HR 70 | Ht 69.0 in | Wt 153.2 lb

## 2022-08-30 DIAGNOSIS — I2510 Atherosclerotic heart disease of native coronary artery without angina pectoris: Secondary | ICD-10-CM | POA: Diagnosis not present

## 2022-08-30 DIAGNOSIS — E785 Hyperlipidemia, unspecified: Secondary | ICD-10-CM

## 2022-08-30 NOTE — Patient Instructions (Signed)
Medication Instructions:  Your physician recommends that you continue on your current medications as directed. Please refer to the Current Medication list given to you today.  *If you need a refill on your cardiac medications before your next appointment, please call your pharmacy*  Lab Work: None ordered  Testing/Procedures: None ordered  Follow-Up: At [facility], you and your health needs are our priority.  As part of our continuing mission to provide you with exceptional heart care, we have created designated Provider Care Teams.  These Care Teams include your primary Cardiologist (physician) and Advanced Practice Providers (APPs -  Physician Assistants and Nurse Practitioners) who all work together to provide you with the care you need, when you need it.   Your next appointment:   1 year(s)  The format for your next appointment:   In Person  Provider:   Mertie Moores, MD {

## 2022-08-30 NOTE — Progress Notes (Unsigned)
Brandon Roy Date of Birth  [birth year]       [facility]    [place], [place]  [address]               F Problem List: 1. Coronary artery disease-status post PTCA and stenting of his proximal LAD using a 3.5 x 23 mm and distal LAD stent using a 2.25 mm Taxus stent. [name] also status post stenting of the right coronary artery using a 3.0 x 15 mm Promus stent dilated with a 3.5 mm noncompliant balloon. 2. Hypertension 3. Hyperlipidemia   Brandon Roy is a 89 year old gentleman with a history of coronary artery disease. Status post PTCA and stenting of his proximal left anterior descending artery using a 3.5 mm x 23 mm stent. He also has a stent in his distal left anterior descending artery-2.25 mm Taxus stent) she's.  He is also status post PTCA and stenting of the right coronary artery using a 3.0 x 15 mm Primus stent post dilated using a 3.5 mm noncompliant balloon.  He also has a history of hypertension and hyperlipidemia. His pharmacy changed him to generic Diovan/HCT. Following this he developed an itchy rash. He went back to the Brand name Diovan and feels much better.  January 02, 2013:  Brandon Roy is doing well.  No CP.  Staying busy.    01/05/2014:  Brandon Roy is doing ok.  Able to do all of his normal activities. [name] not having any chest pain.  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    February 04, 2016:  Staying busy.  No angina   Aug. 27, 2018:  Doing well. Staying active.   Going camping with grandson. Lipids are managed by Dr. Virgina Jock.    Oct. 2, 2019: Doing well.    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   Nov. 9, 2020   Brandon Roy is seen today for follow up of his CAD Found to have severe carotid disease.   Now s/p L CEA . Has severe right carotid disease also .   No CP   Nov. 8, 2021: Brandon Roy is seen today for follow-up of his coronary artery disease.  He has severe carotid artery disease. He has an occluded  right carotid artery.  He status post left carotid endarterectomy.  He [facility]ta [facility]  Has not had covid vaccine We discussed this  I think he should get the vaccine .   Labs from his primary medical doctor reveals a total cholesterol of 106 Triglyceride level is 61 HDL is 38 LDL is 56  Glucose level is 91, electrolytes are within normal limits.  White blood cell count is normal.  Hemoglobin is 13.9. PSA is 2.1.  Nov. 4, 2022 Brandon Roy is seen today for follow up of his CAD, Carotid artery disease, Still very active.  Lipid levels from Dr. Keane Police office from May, 2022 look good.  His LDL is well controlled.  Jan. 31, 2024 Brandon Roy is seen for follow up of his CAD , carotid artery disease  Is having some back issues , is working with rehab is is doing better.   No CP , no dyspnea  Has retired ( [place] )   Labs from his primary medical doctor from June, 2023 reveals an  LDL of 65 HDL is 38  total cholesterol is 117 Triglyceride level  71    He has read about [email or web address]         Current Outpatient Medications on File Prior to Visit  Medication Sig Dispense Refill   Ascorbic Acid (VITAMIN C) 1000 MG tablet Take 1,000 mg by mouth daily.     aspirin 81 MG tablet Take 1 tablet (81 mg total) by mouth daily.     cholecalciferol (VITAMIN D3) 25 MCG (1000 UNIT) tablet Take 1,000 Units by mouth daily.     clopidogrel (PLAVIX) 75 MG tablet TAKE 1 TABLET (75 MG TOTAL) BY MOUTH DAILY. 90 tablet 2   ezetimibe (ZETIA) 10 MG tablet Take 1 tablet (10 mg total) by mouth daily. 30 tablet 0   fenofibrate 160 MG tablet TAKE 1 TABLET (160 MG TOTAL) BY MOUTH DAILY. 90 tablet 3   hydrochlorothiazide (HYDRODIURIL) 12.5 MG tablet Take 12.5 mg by mouth daily.  2   irbesartan (AVAPRO) 300 MG tablet Take 1 tablet by mouth daily.     metoprolol tartrate (LOPRESSOR) 50 MG tablet Take 25 mg by mouth 2 (two) times daily.     potassium chloride  (KLOR-CON) 10 MEQ tablet Take 1 tablet (10 mEq total) by mouth daily. 90 tablet 3   rosuvastatin (CRESTOR) 20 MG tablet Take 20 mg by mouth daily.     vitamin B-12 (CYANOCOBALAMIN) 1000 MCG tablet Take 1,000 mcg by mouth at bedtime.      zinc gluconate 50 MG tablet Take 50 mg by mouth daily.     No current facility-administered medications on file prior to visit.    Allergies  Allergen Reactions   Cinnamon Itching   Lipitor [Atorvastatin Calcium] Other (See Comments)    Reaction not recalled- just "didn't work well with me"   Valsartan-Hydrochlorothiazide Itching    The generic version (Valsartan-HCTZ) caused patient to itch   Zocor [Simvastatin] Other (See Comments)    Reaction not recalled- just "didn't work well with me"    Past Medical History:  Diagnosis Date   CAD (coronary artery disease)    Status post recent PTCA and stenting of the proximal LAD (3.77mx23mm Promus stent, post dilated using a 3.75 [place] sprinter) and the distal LAD   Carotid artery occlusion    HTN (hypertension)    Hyperlipidemia    [place]ipheral arterial disease ([place]    Pneumonia 1941   Shingles    Wears dentures    Wears glasses     Past Surgical History:  Procedure Laterality Date   CARDIAC CATHETERIZATION  2000-2011   "6 caths and 3 stents total" (05/17/2018)   CAROTID ENDARTERECTOMY Left 05/17/2018   CATARACT EXTRACTION W/ INTRAOCULAR LENS  IMPLANT, BILATERAL Bilateral    COLONOSCOPY     CORONARY ANGIOPLASTY WITH STENT PLACEMENT  Sep 24, 1998   CORONARY ANGIOPLASTY WITH STENT PLACEMENT     Status post and stenting of his left circumflex artery    ENDARTERECTOMY Left 05/17/2018   Procedure: EXPLORATION OF NECK;  Surgeon: BSerafina Mitchell MD;  [facility]  Service: Vascular;  Laterality: Left;   ENDARTERECTOMY Left 05/17/2018   Procedure: LEFT ENDARTERECTOMY CAROTID;  Surgeon: ERosetta Posner MD;  [facility];  Service: Vascular;  Laterality: Left;   EYE SURGERY     HEMATOMA EVACUATION  Left 05/17/2018   S/P carotid endarterectomy   MULTIPLE TOOTH EXTRACTIONS     PATCH ANGIOPLASTY Left 05/17/2018   Procedure: PATCH ANGIOPLASTY USING HEMASHIELD PLATINUM FINESSE;  Surgeon: ERosetta Posner MD;  [facility]  Service:  Vascular;  Laterality: Left;   TONSILLECTOMY      Social History   Tobacco Use  Smoking Status Former   Years: 2.00   Types: Cigarettes  Smokeless Tobacco Never  Tobacco Comments   " Back in the 50's- 60's"    Social History   Substance and Sexual Activity  Alcohol Use Yes   Comment: 05/17/2018 "might have up to 5 drinks/year"    Family History  Problem Relation Age of Onset   Heart attack Father    Heart disease Father     Reviw of Systems:  Reviewed in the HPI.  All other systems are negative.   Physical Exam: Blood pressure (!) 128/50, pulse 70, height '5\' 9"'$  (1.753 m), weight 153 lb 3.2 oz (69.5 kg), SpO2 97 %.       GEN:  Well nourished, well developed in no acute distress HEENT: Normal NECK: No JVD; No carotid bruits LYMPHATICS: No lymphadenopathy CARDIAC: RRR , no murmurs, rubs, gallops RESPIRATORY:  Clear to auscultation without rales, wheezing or rhonchi  ABDOMEN: Soft, non-tender, non-distended MUSCULOSKELETAL:  No edema; No deformity  SKIN: Warm and dry NEUROLOGIC:  Alert and oriented x 3      ECG:   August 30, 2022: Normal sinus rhythm at 69.  First-degree AV block.  Otherwise normal EKG.    Assessment / Plan:    1. Coronary artery disease-status post PTCA and stenting of his proximal LAD using a 3.5 x 23 mm and distal LAD stent using a 2.25 mm Taxus stent. [name] also status post stenting of the right coronary artery using a 3.0 x 15 mm Promus stent dilated with a 3.5 mm noncompliant balloon. He is doing well.  Is not having any angina.  He has been limited by some back pains.   2. Hypertension -   Blood pressure is well-controlled.  3. Hyperlipidemia -    Lipid levels look good.  His last lipids were  drawn by Dr. Virgina Jock.  His LDL 65.  Continue current medications.  4.   Carotid artery disease. :     Mertie Moores, MD  08/30/2022 11:13 AM    [facility] [place],  [place], [place]  [address] Pager [phone] Phone: [phone]; Fax: [phone]

## 2022-09-04 DIAGNOSIS — H1033 Unspecified acute conjunctivitis, bilateral: Secondary | ICD-10-CM | POA: Diagnosis not present

## 2022-09-04 DIAGNOSIS — H02206 Unspecified lagophthalmos left eye, unspecified eyelid: Secondary | ICD-10-CM | POA: Diagnosis not present

## 2022-09-04 DIAGNOSIS — H04123 Dry eye syndrome of bilateral lacrimal glands: Secondary | ICD-10-CM | POA: Diagnosis not present

## 2022-09-04 DIAGNOSIS — H02203 Unspecified lagophthalmos right eye, unspecified eyelid: Secondary | ICD-10-CM | POA: Diagnosis not present

## 2022-09-21 ENCOUNTER — Other Ambulatory Visit: Payer: Self-pay | Admitting: Cardiovascular Disease

## 2022-09-27 DIAGNOSIS — H26492 Other secondary cataract, left eye: Secondary | ICD-10-CM | POA: Diagnosis not present

## 2022-09-27 DIAGNOSIS — H02403 Unspecified ptosis of bilateral eyelids: Secondary | ICD-10-CM | POA: Diagnosis not present

## 2022-10-05 ENCOUNTER — Other Ambulatory Visit: Payer: Self-pay | Admitting: Internal Medicine

## 2022-10-05 DIAGNOSIS — R634 Abnormal weight loss: Secondary | ICD-10-CM | POA: Diagnosis not present

## 2022-10-05 DIAGNOSIS — I119 Hypertensive heart disease without heart failure: Secondary | ICD-10-CM | POA: Diagnosis not present

## 2022-10-05 DIAGNOSIS — I700 Atherosclerosis of aorta: Secondary | ICD-10-CM | POA: Diagnosis not present

## 2022-10-05 DIAGNOSIS — R972 Elevated prostate specific antigen [PSA]: Secondary | ICD-10-CM | POA: Diagnosis not present

## 2022-10-05 DIAGNOSIS — E538 Deficiency of other specified B group vitamins: Secondary | ICD-10-CM | POA: Diagnosis not present

## 2022-10-05 DIAGNOSIS — M545 Low back pain, unspecified: Secondary | ICD-10-CM | POA: Diagnosis not present

## 2022-10-05 DIAGNOSIS — K117 Disturbances of salivary secretion: Secondary | ICD-10-CM | POA: Diagnosis not present

## 2022-10-05 DIAGNOSIS — E785 Hyperlipidemia, unspecified: Secondary | ICD-10-CM | POA: Diagnosis not present

## 2022-10-05 DIAGNOSIS — D692 Other nonthrombocytopenic purpura: Secondary | ICD-10-CM | POA: Diagnosis not present

## 2022-10-05 DIAGNOSIS — I6529 Occlusion and stenosis of unspecified carotid artery: Secondary | ICD-10-CM | POA: Diagnosis not present

## 2022-10-05 DIAGNOSIS — I2510 Atherosclerotic heart disease of native coronary artery without angina pectoris: Secondary | ICD-10-CM | POA: Diagnosis not present

## 2022-10-05 DIAGNOSIS — R531 Weakness: Secondary | ICD-10-CM | POA: Diagnosis not present

## 2022-10-10 ENCOUNTER — Other Ambulatory Visit: Payer: Self-pay | Admitting: Internal Medicine

## 2022-10-10 ENCOUNTER — Ambulatory Visit
Admission: RE | Admit: 2022-10-10 | Discharge: 2022-10-10 | Disposition: A | Discharge status: Home / Self Care | Payer: Medicare Other | Source: Ambulatory Visit | Attending: Internal Medicine | Admitting: Internal Medicine

## 2022-10-10 ENCOUNTER — Encounter: Payer: Self-pay | Admitting: Internal Medicine

## 2022-10-10 ENCOUNTER — Ambulatory Visit (HOSPITAL_COMMUNITY)
Admission: RE | Admit: 2022-10-10 | Discharge: 2022-10-10 | Disposition: A | Discharge status: Home / Self Care | Payer: Medicare Other | Source: Ambulatory Visit | Attending: Internal Medicine | Admitting: Internal Medicine

## 2022-10-10 DIAGNOSIS — K5790 Diverticulosis of intestine, part unspecified, without perforation or abscess without bleeding: Secondary | ICD-10-CM | POA: Diagnosis not present

## 2022-10-10 DIAGNOSIS — R634 Abnormal weight loss: Secondary | ICD-10-CM | POA: Diagnosis not present

## 2022-10-10 DIAGNOSIS — K8020 Calculus of gallbladder without cholecystitis without obstruction: Secondary | ICD-10-CM | POA: Diagnosis not present

## 2022-10-10 DIAGNOSIS — R531 Weakness: Secondary | ICD-10-CM | POA: Diagnosis not present

## 2022-10-10 DIAGNOSIS — I700 Atherosclerosis of aorta: Secondary | ICD-10-CM | POA: Diagnosis not present

## 2022-10-12 ENCOUNTER — Other Ambulatory Visit: Payer: Self-pay | Admitting: Internal Medicine

## 2022-10-12 DIAGNOSIS — R471 Dysarthria and anarthria: Secondary | ICD-10-CM

## 2022-10-13 ENCOUNTER — Encounter: Payer: Self-pay | Admitting: Family

## 2022-10-16 DIAGNOSIS — Z7952 Long term (current) use of systemic steroids: Secondary | ICD-10-CM | POA: Diagnosis not present

## 2022-10-16 DIAGNOSIS — H3530 Unspecified macular degeneration: Secondary | ICD-10-CM | POA: Diagnosis not present

## 2022-10-16 DIAGNOSIS — E441 Mild protein-calorie malnutrition: Secondary | ICD-10-CM | POA: Diagnosis not present

## 2022-10-16 DIAGNOSIS — R471 Dysarthria and anarthria: Secondary | ICD-10-CM | POA: Diagnosis not present

## 2022-10-16 DIAGNOSIS — H02409 Unspecified ptosis of unspecified eyelid: Secondary | ICD-10-CM | POA: Diagnosis not present

## 2022-10-16 DIAGNOSIS — R634 Abnormal weight loss: Secondary | ICD-10-CM | POA: Diagnosis not present

## 2022-10-16 DIAGNOSIS — I119 Hypertensive heart disease without heart failure: Secondary | ICD-10-CM | POA: Diagnosis not present

## 2022-10-16 DIAGNOSIS — K117 Disturbances of salivary secretion: Secondary | ICD-10-CM | POA: Diagnosis not present

## 2022-10-16 DIAGNOSIS — G7000 Myasthenia gravis without (acute) exacerbation: Secondary | ICD-10-CM | POA: Diagnosis not present

## 2022-10-16 DIAGNOSIS — L89152 Pressure ulcer of sacral region, stage 2: Secondary | ICD-10-CM | POA: Diagnosis not present

## 2022-10-16 DIAGNOSIS — R531 Weakness: Secondary | ICD-10-CM | POA: Diagnosis not present

## 2022-10-18 ENCOUNTER — Ambulatory Visit (INDEPENDENT_AMBULATORY_CARE_PROVIDER_SITE_OTHER): Payer: Medicare Other | Admitting: Neurology

## 2022-10-18 ENCOUNTER — Ambulatory Visit (HOSPITAL_BASED_OUTPATIENT_CLINIC_OR_DEPARTMENT_OTHER): Admission: RE | Admit: 2022-10-18 | Payer: Medicare Other | Source: Ambulatory Visit

## 2022-10-18 ENCOUNTER — Encounter: Payer: Self-pay | Admitting: Neurology

## 2022-10-18 ENCOUNTER — Encounter (HOSPITAL_BASED_OUTPATIENT_CLINIC_OR_DEPARTMENT_OTHER): Payer: Self-pay

## 2022-10-18 VITALS — BP 155/79 | HR 62 | Resp 18 | Ht 69.0 in | Wt 154.0 lb

## 2022-10-18 DIAGNOSIS — G7000 Myasthenia gravis without (acute) exacerbation: Secondary | ICD-10-CM

## 2022-10-18 NOTE — Progress Notes (Signed)
[facility] Clinic Note - Initial Visit   Date: 10/18/2022   Brandon Roy MRN: [id number] DOB: Jan 18, 1934   Dear Dr. Virgina Jock:  Thank you for your kind referral of Brandon Roy for consultation of myasthenia gravis. Although his history is well known to you, please allow [place] to reiterate it for the purpose of our medical record. The patient was accompanied to the clinic by self.   Brandon Roy is a 89 y.o. right-handed male with hypertension, hyperlipidemia, CAD s/p PCI, and carotid stenosis s/p L CEA (2019) presenting for evaluation of seropositive myasthenia gravis.   IMPRESSION/PLAN: Seropositive myasthenia gravis, thymoma negative, diagnosed 09/2022, manifesting with dysphagia, dysarthria, ptosis, and proximal weakness with head drop.  Since starting on mestinon 60mg  QID and prednisone 40mg /d, he has significantly improved and no longer has any evidence of disease manifestation.    I had an extensive discussion with the patient regarding the diagnosis, pathogenesis, prognosis, management plan, meds to avoid, and potential crisis myasthenia gravis. I also discussed the warning signs and symptoms of MG crisis (including significant swallowing and breathing difficulty) that should prompt urgent medical evaluation since an exacerbation of myasthenia gravis is potentially life-threatening. He was also provided with a list of medications that can worsen MG and that he should avoid.   For management, I agree with prednisone taper as recommended by Dr. Virgina Jock.  He will continue prednisone 40mg  for two more days, then reduce to 20mg  x 2 weeks, then 10mg  daily thereafter.  He will stay on prednisone 10mg  daily until he sees me again in follow-up. If he develops any weakness while tapering the medication, he was instructed to contact me.  I also recommend that he reduce mestinon to 60mg  three times daily (8a, noon, and 4pm).   All questions answered.    Return to clinic in  6 weeks, or sooner as needed  ------------------------------------------------------------- History of present illness: Starting around January, he began having unintentional weight loss, difficulty with swallowing, which progressed into slurred speech, droopiness of the eyelids and difficulty holding his head upright, and unsteadiness.  He saw his PCP for these symptoms who check actetylcholine receptor antibody which returned positive.  All of his symptoms quickly improved after taking mestinon 60mg  four times daily and prednisone 40mg  which he has been taking for 5 days.  He currently denies any problems with speech, swallow, weakness, double vision, or droopiness of the eyelids.  He has regained about 8lb over the past week.    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 He is the primary caregiver to his wife. He has two sons and 3 grandchildren.  Out-side paper records, electronic medical record, and images have been reviewed where available and summarized as:  Labs 10/10/2022: AChR binding 28* Labs 10/05/2022:  Normal except glucose 136 and potassium 3.2, CBC normal   CT chest wo contrast 10/10/2022: 1. No acute chest finding. No cause of weight loss identified. 2. Extensive coronary artery calcification and/or stents. Aortic atherosclerotic calcification. 3. Single small calcified gallstone in the gallbladder without CT evidence of cholecystitis or obstruction.   CT head wo contast 12/03/2018: 1. No acute intracranial findings. 2. Atherosclerosis. 3. Mild chronic right maxillary and ethmoid sinusitis.  Past Medical History:  Diagnosis Date   CAD (coronary artery disease)    Status post recent PTCA and stenting of the proximal LAD (3.27mmx23mm Promus stent, post dilated using a 3.75 [place] sprinter)  and the distal LAD   Carotid artery occlusion    HTN (hypertension)    Hyperlipidemia    Peripheral arterial disease ([place])     Pneumonia 1941   Shingles    Wears dentures    Wears glasses     Past Surgical History:  Procedure Laterality Date   CARDIAC CATHETERIZATION  2000-2011   "6 caths and 3 stents total" (05/17/2018)   CAROTID ENDARTERECTOMY Left 05/17/2018   CATARACT EXTRACTION W/ INTRAOCULAR LENS  IMPLANT, BILATERAL Bilateral    COLONOSCOPY     CORONARY ANGIOPLASTY WITH STENT PLACEMENT  Sep 24, 1998   CORONARY ANGIOPLASTY WITH STENT PLACEMENT     Status post and stenting of his left circumflex artery    ENDARTERECTOMY Left 05/17/2018   Procedure: EXPLORATION OF NECK;  Surgeon: Serafina Mitchell, MD;  Location: [facility];  Service: Vascular;  Laterality: Left;   ENDARTERECTOMY Left 05/17/2018   Procedure: LEFT ENDARTERECTOMY CAROTID;  Surgeon: Rosetta Posner, MD;  Location: [facility];  Service: Vascular;  Laterality: Left;   EYE SURGERY     HEMATOMA EVACUATION Left 05/17/2018   S/P carotid endarterectomy   MULTIPLE TOOTH EXTRACTIONS     PATCH ANGIOPLASTY Left 05/17/2018   Procedure: PATCH ANGIOPLASTY USING HEMASHIELD PLATINUM FINESSE;  Surgeon: Rosetta Posner, MD;  Location: [facility];  Service: Vascular;  Laterality: Left;   TONSILLECTOMY       Medications:  Outpatient Encounter Medications as of 10/18/2022  Medication Sig   Ascorbic Acid (VITAMIN C) 1000 MG tablet Take 1,000 mg by mouth daily.   aspirin 81 MG tablet Take 1 tablet (81 mg total) by mouth daily.   cholecalciferol (VITAMIN D3) 25 MCG (1000 UNIT) tablet Take 1,000 Units by mouth daily.   clopidogrel (PLAVIX) 75 MG tablet TAKE 1 TABLET (75 MG TOTAL) BY MOUTH DAILY.   ezetimibe (ZETIA) 10 MG tablet Take 1 tablet (10 mg total) by mouth daily. Please schedule an overdue appointment for future refills   fenofibrate 160 MG tablet TAKE 1 TABLET (160 MG TOTAL) BY MOUTH DAILY.   hydrochlorothiazide (HYDRODIURIL) 12.5 MG tablet Take 12.5 mg by mouth daily.   irbesartan (AVAPRO) 300 MG tablet Take 1 tablet by mouth daily.   metoprolol tartrate (LOPRESSOR) 50  MG tablet Take 25 mg by mouth 2 (two) times daily.   potassium chloride (KLOR-CON) 10 MEQ tablet Take 1 tablet (10 mEq total) by mouth daily.   rosuvastatin (CRESTOR) 20 MG tablet Take 20 mg by mouth daily.   vitamin B-12 (CYANOCOBALAMIN) 1000 MCG tablet Take 1,000 mcg by mouth at bedtime.    zinc gluconate 50 MG tablet Take 50 mg by mouth daily.   No facility-administered encounter medications on file as of 10/18/2022.    Allergies:  Allergies  Allergen Reactions   Cinnamon Itching   Lipitor [Atorvastatin Calcium] Other (See Comments)    Reaction not recalled- just "didn't work well with me"   Shellfish Allergy Other (See Comments)   Tape Other (See Comments)   Valsartan-Hydrochlorothiazide Itching    The generic version (Valsartan-HCTZ) caused patient to itch   Zocor [Simvastatin] Other (See Comments)    Reaction not recalled- just "didn't work well with me"    Family History: Family History  Problem Relation Age of Onset   Heart attack Father    Heart disease Father     Social History: Social History   Tobacco Use   Smoking status: Former    Years: 2    Types: Cigarettes  Smokeless tobacco: Never   Tobacco comments:    " Back in the 50's- 60's"  Vaping Use   Vaping Use: Never used  Substance Use Topics   Alcohol use: Yes    Comment: 05/17/2018 "might have up to 5 drinks/year"   Drug use: Never   Social History   Social History Narrative   Right handed   Drinks caffeine   One floor home   Lives with wife   retired    Vital Signs:  BP (!) 155/79   Pulse 62   Resp 18   Ht 5\' 9"  (1.753 m)   Wt 154 lb (69.9 kg)   SpO2 99%   BMI 22.74 kg/m   Neurological Exam: MENTAL STATUS including orientation to time, place, person, recent and remote memory, attention span and concentration, language, and fund of knowledge is normal.  Speech is not dysarthric, there is a mild hoarseness quality to his speech.  CRANIAL NERVES: II:  No visual field defects.      III-IV-VI: Pupils equal round and reactive to light.  Normal conjugate, extra-ocular eye movements in all directions of gaze.  No nystagmus.  No ptosis at rest or with sustained upgaze.   V:  Normal facial sensation.    VII:  Normal facial symmetry and movements.   VIII:  Normal hearing and vestibular function.   IX-X:  Normal palatal movement.   XI:  Normal shoulder shrug and head rotation.   XII:  Normal tongue strength and range of motion, no deviation or fasciculation.  MOTOR:  No atrophy, fasciculations or abnormal movements.  No pronator drift.  Neck flexion is 5/5 Upper Extremity:  Right  Left  Deltoid  5/5   5/5   Biceps  5/5   5/5   Wrist extensors  5/5   5/5   Wrist flexors  5/5   5/5   Finger extensors  5/5   5/5   Finger flexors  5/5   5/5   Dorsal interossei  5/5   5/5   Abductor pollicis  5/5   5/5   Tone (Ashworth scale)  0  0   Lower Extremity:  Right  Left  Hip flexors  5/5   5/5   Knee flexors  5/5   5/5   Knee extensors  5/5   5/5   Dorsiflexors  5/5   5/5   Plantarflexors  5/5   5/5   Toe extensors  5/5   5/5   Toe flexors  5/5   5/5   Tone (Ashworth scale)  0  0   MSRs:                                           Right        Left brachioradialis 2+  2+  biceps 2+  2+  triceps 2+  2+  patellar 2+  2+  ankle jerk 2+  2+  Hoffman no  no  plantar response down  down   SENSORY:  Normal and symmetric perception of light touch and vibration.    COORDINATION/GAIT: Normal finger-to- nose-finger.  Intact rapid alternating movements bilaterally.  Able to rise from a chair without using arms.  Gait narrow based and stable. Stressed gait intact. Mild unsteadiness with tandem gait, but able to perform.  Total time spent reviewing records, interview, history/exam, documentation, and coordination of care  on day of encounter:  45 min   Thank you for allowing me to participate in patient's care.  If I can answer any additional questions, I would be pleased to do  so.    Sincerely,    Aldrin Engelhard K. Posey Pronto, DO

## 2022-10-18 NOTE — Patient Instructions (Signed)
Reduce mestinon to 60mg  three times daily at 8am, 12pm, and 4pm  Reduce prednisone to 20mg  on Friday x 2 weeks, then reduce to 10mg  daily  I will see you back in May

## 2023-01-09 ENCOUNTER — Ambulatory Visit: Payer: Medicare Other | Admitting: Neurology

## 2023-01-09 ENCOUNTER — Encounter: Payer: Self-pay | Admitting: Neurology

## 2023-01-09 DIAGNOSIS — Z029 Encounter for administrative examinations, unspecified: Secondary | ICD-10-CM

## 2023-01-16 DIAGNOSIS — E538 Deficiency of other specified B group vitamins: Secondary | ICD-10-CM | POA: Diagnosis not present

## 2023-01-17 DIAGNOSIS — Z125 Encounter for screening for malignant neoplasm of prostate: Secondary | ICD-10-CM | POA: Diagnosis not present

## 2023-01-17 DIAGNOSIS — E785 Hyperlipidemia, unspecified: Secondary | ICD-10-CM | POA: Diagnosis not present

## 2023-01-17 DIAGNOSIS — I700 Atherosclerosis of aorta: Secondary | ICD-10-CM | POA: Diagnosis not present

## 2023-01-19 DIAGNOSIS — R972 Elevated prostate specific antigen [PSA]: Secondary | ICD-10-CM | POA: Diagnosis not present

## 2023-01-23 DIAGNOSIS — E785 Hyperlipidemia, unspecified: Secondary | ICD-10-CM | POA: Diagnosis not present

## 2023-01-23 DIAGNOSIS — I2510 Atherosclerotic heart disease of native coronary artery without angina pectoris: Secondary | ICD-10-CM | POA: Diagnosis not present

## 2023-01-23 DIAGNOSIS — R82998 Other abnormal findings in urine: Secondary | ICD-10-CM | POA: Diagnosis not present

## 2023-01-23 DIAGNOSIS — I700 Atherosclerosis of aorta: Secondary | ICD-10-CM | POA: Diagnosis not present

## 2023-01-23 DIAGNOSIS — E441 Mild protein-calorie malnutrition: Secondary | ICD-10-CM | POA: Diagnosis not present

## 2023-01-23 DIAGNOSIS — I6529 Occlusion and stenosis of unspecified carotid artery: Secondary | ICD-10-CM | POA: Diagnosis not present

## 2023-01-23 DIAGNOSIS — I119 Hypertensive heart disease without heart failure: Secondary | ICD-10-CM | POA: Diagnosis not present

## 2023-01-23 DIAGNOSIS — R972 Elevated prostate specific antigen [PSA]: Secondary | ICD-10-CM | POA: Diagnosis not present

## 2023-01-23 DIAGNOSIS — D692 Other nonthrombocytopenic purpura: Secondary | ICD-10-CM | POA: Diagnosis not present

## 2023-01-23 DIAGNOSIS — Z1331 Encounter for screening for depression: Secondary | ICD-10-CM | POA: Diagnosis not present

## 2023-01-23 DIAGNOSIS — G7000 Myasthenia gravis without (acute) exacerbation: Secondary | ICD-10-CM | POA: Diagnosis not present

## 2023-01-23 DIAGNOSIS — Z0000 Encounter for general adult medical examination without abnormal findings: Secondary | ICD-10-CM | POA: Diagnosis not present

## 2023-01-23 DIAGNOSIS — R634 Abnormal weight loss: Secondary | ICD-10-CM | POA: Diagnosis not present

## 2023-01-30 ENCOUNTER — Encounter: Payer: Self-pay | Admitting: Neurology

## 2023-01-30 ENCOUNTER — Ambulatory Visit (INDEPENDENT_AMBULATORY_CARE_PROVIDER_SITE_OTHER): Payer: Medicare Other | Admitting: Neurology

## 2023-01-30 VITALS — BP 128/62 | HR 86 | Ht 69.0 in | Wt 156.0 lb

## 2023-01-30 DIAGNOSIS — G7001 Myasthenia gravis with (acute) exacerbation: Secondary | ICD-10-CM

## 2023-01-30 NOTE — Patient Instructions (Addendum)
Start prednisone 10mg  daily  Continue mestinon 60mg  three times daily  Return to clinic in in 4-6 weeks

## 2023-01-30 NOTE — Progress Notes (Signed)
Follow-up Visit   Date: 01/30/2023    Brandon Roy MRN: [id number] DOB: June 14, 1934    Brandon Roy is a 89 y.o. right-handed Caucasian male with hypertension, hyperlipidemia, CAD s/p PCI, and carotid stenosis s/p L CEA (2019) returning to the clinic for follow-up of seropositive myasthenia gravis.  The patient was accompanied to the clinic by self.   IMPRESSION/PLAN: Seropositive myasthenia gravis with exacerbation, thymoma negative.  Diagnosed 09/2022. Symptoms manifested with dysphagia, dysarthria, ptosis, and proximal weakness with head drop.  He responded well to mestinon 60mg  QID and prednisone 40mg /d.  He tapered off prednisone and reports being off it for at least the past 6 weeks.  He remains on mestinon 60mg  TID.   On exam today, there is mild bulbar weakness and head flexion weakness.  Recommend that he start prednisone 10mg  daily Continue mestinon 60mg  three times daily (8a, noon, and 4p)  Return to clinic in 4-6 weeks   --------------------------------------------- History of present illness: Starting around January, he began having unintentional weight loss, difficulty with swallowing, which progressed into slurred speech, droopiness of the eyelids and difficulty holding his head upright, and unsteadiness.  He saw his PCP for these symptoms who check actetylcholine receptor antibody which returned positive.  All of his symptoms quickly improved after taking mestinon 60mg  four times daily and prednisone 40mg  which he has been taking for 5 days.  He currently denies any problems with speech, swallow, weakness, double vision, or droopiness of the eyelids.  He has regained about 8lb over the past week.     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 He is the primary caregiver to his wife. He has two sons and 3 grandchildren.  UPDATE 01/30/2023:    He is here for follow-up visit at there request of PCP.  He was last  seen in March for seropositive myasthenia gravis and scheduled follow-up in 6 weeks, but no showed.  He reports tapering off prednisone and has been off it for at least the past 6 weeks. He feels that he is doing well and denies weakness.  However, there is mild dysarthria when he is talking.  He denies double vision, difficulty with swallowing, or limb weakness.   Medications:  Current Outpatient Medications on File Prior to Visit  Medication Sig Dispense Refill   Ascorbic Acid (VITAMIN C) 1000 MG tablet Take 1,000 mg by mouth daily.     aspirin 81 MG tablet Take 1 tablet (81 mg total) by mouth daily.     cholecalciferol (VITAMIN D3) 25 MCG (1000 UNIT) tablet Take 1,000 Units by mouth daily.     clopidogrel (PLAVIX) 75 MG tablet TAKE 1 TABLET (75 MG TOTAL) BY MOUTH DAILY. 90 tablet 2   ezetimibe (ZETIA) 10 MG tablet Take 1 tablet (10 mg total) by mouth daily. Please schedule an overdue appointment for future refills 90 tablet 3   fenofibrate 160 MG tablet TAKE 1 TABLET (160 MG TOTAL) BY MOUTH DAILY. 90 tablet 3   irbesartan (AVAPRO) 300 MG tablet Take 1 tablet by mouth daily.     metoprolol tartrate (LOPRESSOR) 50 MG tablet Take 25 mg by mouth 2 (two) times daily.     potassium chloride (KLOR-CON) 10 MEQ tablet Take 1 tablet (10 mEq total) by mouth daily. 90 tablet 3   pyridostigmine (MESTINON) 60 MG tablet Take 60 mg by mouth 3 (three) times daily.     rosuvastatin (CRESTOR)  20 MG tablet Take 20 mg by mouth daily.     vitamin B-12 (CYANOCOBALAMIN) 1000 MCG tablet Take 1,000 mcg by mouth at bedtime.      zinc gluconate 50 MG tablet Take 50 mg by mouth daily.     No current facility-administered medications on file prior to visit.    Allergies:  Allergies  Allergen Reactions   Cinnamon Itching   Lipitor [Atorvastatin Calcium] Other (See Comments)    Reaction not recalled- just "didn't work well with me"   Shellfish Allergy Other (See Comments)   Tape Other (See Comments)    Valsartan-Hydrochlorothiazide Itching    The generic version (Valsartan-HCTZ) caused patient to itch   Zocor [Simvastatin] Other (See Comments)    Reaction not recalled- just "didn't work well with me"    Vital Signs:  BP 128/62   Pulse 86   Ht 5\' 9"  (1.753 m)   Wt 156 lb (70.8 kg)   SpO2 98%   BMI 23.04 kg/m   Neurological Exam: MENTAL STATUS including orientation to time, place, person, recent and remote memory, attention span and concentration, language, and fund of knowledge is normal.  Speech is mildly dysarthric, worse with prolonged talking.  CRANIAL NERVES:   Pupils equal round and reactive to light.  Normal conjugate, extra-ocular eye movements in all directions of gaze.  No ptosis at baseline or with sustained upgaze.  Oribicularis oculi 5/5, buccinator 4/5.  Face is symmetric. Palate elevates symmetrically.  Tongue is midline, side-to-side movements are mildly slowed and there is weakness with protrusion into the cheek (4/5).   MOTOR:  Motor strength is 5/5 in all extremities, however neck flexion is 5-/5.  No atrophy, fasciculations or abnormal movements.   COORDINATION/GAIT:  He is able to stand up from chair without using arms to push off. Gait mildly wide-based and stable.   Data: Labs 10/10/2022: AChR binding 28* Labs 10/05/2022:  Normal except glucose 136 and potassium 3.2, CBC normal    CT chest wo contrast 10/10/2022: 1. No acute chest finding. No cause of weight loss identified. 2. Extensive coronary artery calcification and/or stents. Aortic atherosclerotic calcification. 3. Single small calcified gallstone in the gallbladder without CT evidence of cholecystitis or obstruction.   CT head wo contast 12/03/2018: 1. No acute intracranial findings. 2. Atherosclerosis. 3. Mild chronic right maxillary and ethmoid sinusitis.   Thank you for allowing me to participate in patient's care.  If I can answer any additional questions, I would be pleased to do so.     Sincerely,    Maciah Schweigert K. Allena Katz, DO

## 2023-03-05 ENCOUNTER — Ambulatory Visit (INDEPENDENT_AMBULATORY_CARE_PROVIDER_SITE_OTHER): Payer: Medicare Other | Admitting: Neurology

## 2023-03-05 VITALS — BP 117/53 | HR 70 | Ht 68.0 in | Wt 155.8 lb

## 2023-03-05 DIAGNOSIS — G7001 Myasthenia gravis with (acute) exacerbation: Secondary | ICD-10-CM

## 2023-03-05 MED ORDER — PREDNISONE 10 MG PO TABS: 10.0000 mg | ORAL_TABLET | Freq: Every day | ORAL | 2 refills | Status: DC

## 2023-03-05 NOTE — Patient Instructions (Signed)
Continue prednisone 10mg  daily and mestinon 60mg  three times daily

## 2023-03-05 NOTE — Progress Notes (Signed)
Follow-up Visit   Date: 03/05/2023    Brandon Roy MRN: [id number] DOB: 1933-09-24    Brandon Roy is a 89 y.o. right-handed Caucasian male with hypertension, hyperlipidemia, CAD s/p PCI, and carotid stenosis s/p L CEA (2019) returning to the clinic for follow-up of seropositive myasthenia gravis.  The patient was accompanied to the clinic by self.   IMPRESSION/PLAN: Seropositive myasthenia gravis with mild exacerbation, thymoma negative.  Diagnosed 09/2022. Symptoms manifested with dysphagia, dysarthria, ptosis, and proximal weakness with head drop.  He responded well to mestinon 60mg  QID and prednisone 40mg /d.  Exam today continues to show mild dysarthria and facial weakness.   Continue prednisone 10mg  daily Continue mestinon 60mg  three times daily (8a, noon, and 4p)  Return to clinic in 2 months  --------------------------------------------- History of present illness: Starting around January, he began having unintentional weight loss, difficulty with swallowing, which progressed into slurred speech, droopiness of the eyelids and difficulty holding his head upright, and unsteadiness.  He saw his PCP for these symptoms who check actetylcholine receptor antibody which returned positive.  All of his symptoms quickly improved after taking mestinon 60mg  four times daily and prednisone 40mg  which he has been taking for 5 days.  He currently denies any problems with speech, swallow, weakness, double vision, or droopiness of the eyelids.  He has regained about 8lb over the past week.     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 He is the primary caregiver to his wife. He has two sons and 3 grandchildren.  UPDATE 01/30/2023:    He is here for follow-up visit at there request of PCP.  He was last seen in March for seropositive myasthenia gravis and scheduled follow-up in 6 weeks, but no showed.  He reports tapering off prednisone  and has been off it for at least the past 6 weeks. He feels that he is doing well and denies weakness.  However, there is mild dysarthria when he is talking.  He denies double vision, difficulty with swallowing, or limb weakness.   UPDATE 03/05/2023:  He is here for follow-up visit.  He has been compliant with prednisone 10mg  daily and mestinon 60mg  three times daily.  He denies difficulty with speech, swallow, weakness, double vision, or droopy eyelids.    Medications:  Current Outpatient Medications on File Prior to Visit  Medication Sig Dispense Refill   Ascorbic Acid (VITAMIN C) 1000 MG tablet Take 1,000 mg by mouth daily.     aspirin 81 MG tablet Take 1 tablet (81 mg total) by mouth daily.     cholecalciferol (VITAMIN D3) 25 MCG (1000 UNIT) tablet Take 1,000 Units by mouth daily.     clopidogrel (PLAVIX) 75 MG tablet TAKE 1 TABLET (75 MG TOTAL) BY MOUTH DAILY. 90 tablet 2   ezetimibe (ZETIA) 10 MG tablet Take 1 tablet (10 mg total) by mouth daily. Please schedule an overdue appointment for future refills 90 tablet 3   fenofibrate 160 MG tablet TAKE 1 TABLET (160 MG TOTAL) BY MOUTH DAILY. 90 tablet 3   metoprolol tartrate (LOPRESSOR) 50 MG tablet Take 25 mg by mouth 2 (two) times daily.     pyridostigmine (MESTINON) 60 MG tablet Take 60 mg by mouth 3 (three) times daily.     rosuvastatin (CRESTOR) 20 MG tablet Take 20 mg by mouth daily.     vitamin B-12 (CYANOCOBALAMIN) 1000 MCG tablet Take 1,000 mcg by mouth  at bedtime.      zinc gluconate 50 MG tablet Take 50 mg by mouth daily.     No current facility-administered medications on file prior to visit.    Allergies:  Allergies  Allergen Reactions   Cinnamon Itching   Lipitor [Atorvastatin Calcium] Other (See Comments)    Reaction not recalled- just "didn't work well with me"   Shellfish Allergy Other (See Comments)   Tape Other (See Comments)   Valsartan-Hydrochlorothiazide Itching    The generic version (Valsartan-HCTZ) caused  patient to itch   Zocor [Simvastatin] Other (See Comments)    Reaction not recalled- just "didn't work well with me"    Vital Signs:  BP (!) 117/53   Pulse 70   Ht 5\' 8"  (1.727 m)   Wt 155 lb 12.8 oz (70.7 kg)   SpO2 92%   BMI 23.69 kg/m   Neurological Exam: MENTAL STATUS including orientation to time, place, person, recent and remote memory, attention span and concentration, language, and fund of knowledge is normal.  Speech continues to have mild dysarthria with prolonged speech.    CRANIAL NERVES:   Pupils equal round and reactive to light.  Normal conjugate, extra-ocular eye movements in all directions of gaze.  No ptosis at baseline or with sustained upgaze.  Oribicularis oculi 5/5, buccinator 4/5.  Face is symmetric. Palate elevates symmetrically.  Tongue is midline, side-to-side movements are mildly slowed and there is weakness with protrusion into the cheek (5/5).   MOTOR:  Motor strength is 5/5 in all extremities, however neck flexion is 5/5.  No atrophy, fasciculations or abnormal movements.   COORDINATION/GAIT:  He is able to stand up from chair without using arms to push off. Gait mildly wide-based and stable.   Data: Labs 10/10/2022: AChR binding 28* Labs 10/05/2022:  Normal except glucose 136 and potassium 3.2, CBC normal    CT chest wo contrast 10/10/2022: 1. No acute chest finding. No cause of weight loss identified. 2. Extensive coronary artery calcification and/or stents. Aortic atherosclerotic calcification. 3. Single small calcified gallstone in the gallbladder without CT evidence of cholecystitis or obstruction.   CT head wo contast 12/03/2018: 1. No acute intracranial findings. 2. Atherosclerosis. 3. Mild chronic right maxillary and ethmoid sinusitis.   Thank you for allowing me to participate in patient's care.  If I can answer any additional questions, I would be pleased to do so.    Sincerely,    Terria Deschepper K. Allena Katz, DO

## 2023-05-09 ENCOUNTER — Encounter: Payer: Self-pay | Admitting: Neurology

## 2023-05-09 ENCOUNTER — Ambulatory Visit (INDEPENDENT_AMBULATORY_CARE_PROVIDER_SITE_OTHER): Payer: Medicare Other | Admitting: Neurology

## 2023-05-09 VITALS — BP 139/73 | HR 92 | Ht 68.0 in | Wt 154.0 lb

## 2023-05-09 DIAGNOSIS — Z79899 Other long term (current) drug therapy: Secondary | ICD-10-CM

## 2023-05-09 DIAGNOSIS — G7000 Myasthenia gravis without (acute) exacerbation: Secondary | ICD-10-CM

## 2023-05-09 MED ORDER — PREDNISONE 5 MG PO TABS: 5.0000 mg | ORAL_TABLET | Freq: Every day | ORAL | 1 refills | Status: AC

## 2023-05-09 NOTE — Progress Notes (Signed)
Follow-up Visit   Date: 05/09/2023    Brandon Roy MRN: [id number] DOB: 1934/03/10    Brandon Roy is a 89 y.o. right-handed Caucasian male with hypertension, hyperlipidemia, CAD s/p PCI, and carotid stenosis s/p L CEA (2019) returning to the clinic for follow-up of seropositive myasthenia gravis.  The patient was accompanied to the clinic by self.   IMPRESSION/PLAN: Seropositive myasthenia gravis without exacerbation, thymoma negative.  Diagnosed 09/2022. Symptoms manifested with dysphagia, dysarthria, ptosis, and proximal weakness with head drop.  He responded well to mestinon 60mg  QID and prednisone 40mg /d.  There is no evidence of disease manifestation on exam today.  Taper prednisone to 10mg  alternating with 5mg  x 1 month, then 5mg  daily Continue mestinon 60mg  three times daily (8a, noon, and 4p)  Return to clinic in 2 months  --------------------------------------------- History of present illness: Starting around January, he began having unintentional weight loss, difficulty with swallowing, which progressed into slurred speech, droopiness of the eyelids and difficulty holding his head upright, and unsteadiness.  He saw his PCP for these symptoms who check actetylcholine receptor antibody which returned positive.  All of his symptoms quickly improved after taking mestinon 60mg  four times daily and prednisone 40mg  which he has been taking for 5 days.  He currently denies any problems with speech, swallow, weakness, double vision, or droopiness of the eyelids.  He has regained about 8lb over the past week.     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 He is the primary caregiver to his wife. He has two sons and 3 grandchildren.  UPDATE 01/30/2023:    He is here for follow-up visit at there request of PCP.  He was last seen in March for seropositive myasthenia gravis and scheduled follow-up in 6 weeks, but no showed.  He  reports tapering off prednisone and has been off it for at least the past 6 weeks. He feels that he is doing well and denies weakness.  However, there is mild dysarthria when he is talking.  He denies double vision, difficulty with swallowing, or limb weakness.   UPDATE 03/05/2023:  He is here for follow-up visit.  He has been compliant with prednisone 10mg  daily and mestinon 60mg  three times daily.  He denies difficulty with speech, swallow, weakness, double vision, or droopy eyelids.    UPDATE 05/09/2023:  He is here for 2 month follow-up visit.  He has been doing well and been compliant on prednisone 10mg  daily and mestinon 60mg  three times daily.  Yesterday, he was able to mow his front lawn with a push mower without difficulty.  He has not had any problems with speech, swallow, weakness, double vision, or droopy eyelids.   Medications:  Current Outpatient Medications on File Prior to Visit  Medication Sig Dispense Refill   Ascorbic Acid (VITAMIN C) 1000 MG tablet Take 1,000 mg by mouth daily.     aspirin 81 MG tablet Take 1 tablet (81 mg total) by mouth daily.     cholecalciferol (VITAMIN D3) 25 MCG (1000 UNIT) tablet Take 1,000 Units by mouth daily.     clopidogrel (PLAVIX) 75 MG tablet TAKE 1 TABLET (75 MG TOTAL) BY MOUTH DAILY. 90 tablet 2   ezetimibe (ZETIA) 10 MG tablet Take 1 tablet (10 mg total) by mouth daily. Please schedule an overdue appointment for future refills 90 tablet 3   fenofibrate 160 MG tablet TAKE 1 TABLET (160 MG TOTAL)  BY MOUTH DAILY. 90 tablet 3   metoprolol tartrate (LOPRESSOR) 50 MG tablet Take 25 mg by mouth 2 (two) times daily.     predniSONE (DELTASONE) 10 MG tablet Take 1 tablet (10 mg total) by mouth daily with breakfast. 30 tablet 2   pyridostigmine (MESTINON) 60 MG tablet Take 60 mg by mouth 3 (three) times daily.     rosuvastatin (CRESTOR) 20 MG tablet Take 20 mg by mouth daily.     vitamin B-12 (CYANOCOBALAMIN) 1000 MCG tablet Take 1,000 mcg by mouth at  bedtime.      zinc gluconate 50 MG tablet Take 50 mg by mouth daily.     No current facility-administered medications on file prior to visit.    Allergies:  Allergies  Allergen Reactions   Cinnamon Itching   Lipitor [Atorvastatin Calcium] Other (See Comments)    Reaction not recalled- just "didn't work well with me"   Shellfish Allergy Other (See Comments)   Tape Other (See Comments)   Valsartan-Hydrochlorothiazide Itching    The generic version (Valsartan-HCTZ) caused patient to itch   Zocor [Simvastatin] Other (See Comments)    Reaction not recalled- just "didn't work well with me"    Vital Signs:  BP 139/73   Pulse 92   Ht 5\' 8"  (1.727 m)   Wt 154 lb (69.9 kg)   SpO2 97%   BMI 23.42 kg/m   Neurological Exam: MENTAL STATUS including orientation to time, place, person, recent and remote memory, attention span and concentration, language, and fund of knowledge is normal.  Speech is soft, no dysarthria.   CRANIAL NERVES:   Pupils equal round and reactive to light.  Normal conjugate, extra-ocular eye movements in all directions of gaze.  No ptosis at baseline or with sustained upgaze.  Oribicularis oculi 5/5, buccinator 5/5 (improved).  Face is symmetric. Palate elevates symmetrically.  Tongue is midline, side-to-side movements are intact.   MOTOR:  Motor strength is 5/5 in all extremities, however neck flexion is 5/5.  No atrophy, fasciculations or abnormal movements.   COORDINATION/GAIT:  He is able to stand up from chair without using arms to push off. Gait mildly wide-based and stable.   Data: Labs 10/10/2022: AChR binding 28* Labs 10/05/2022:  Normal except glucose 136 and potassium 3.2, CBC normal    CT chest wo contrast 10/10/2022: 1. No acute chest finding. No cause of weight loss identified. 2. Extensive coronary artery calcification and/or stents. Aortic atherosclerotic calcification. 3. Single small calcified gallstone in the gallbladder without CT evidence of  cholecystitis or obstruction.   CT head wo contast 12/03/2018: 1. No acute intracranial findings. 2. Atherosclerosis. 3. Mild chronic right maxillary and ethmoid sinusitis.   Thank you for allowing me to participate in patient's care.  If I can answer any additional questions, I would be pleased to do so.    Sincerely,    Lysbeth Dicola K. Allena Katz, DO

## 2023-05-09 NOTE — Patient Instructions (Signed)
Reduce prednisone to 10mg  alternating with 5mg  daily for one month, then reduce to 5mg  daily.

## 2023-07-11 ENCOUNTER — Ambulatory Visit: Payer: Medicare Other | Admitting: Neurology

## 2023-07-18 DIAGNOSIS — I119 Hypertensive heart disease without heart failure: Secondary | ICD-10-CM | POA: Diagnosis not present

## 2023-07-18 DIAGNOSIS — I700 Atherosclerosis of aorta: Secondary | ICD-10-CM | POA: Diagnosis not present

## 2023-07-18 DIAGNOSIS — R972 Elevated prostate specific antigen [PSA]: Secondary | ICD-10-CM | POA: Diagnosis not present

## 2023-07-18 DIAGNOSIS — G7000 Myasthenia gravis without (acute) exacerbation: Secondary | ICD-10-CM | POA: Diagnosis not present

## 2023-07-18 DIAGNOSIS — Z7952 Long term (current) use of systemic steroids: Secondary | ICD-10-CM | POA: Diagnosis not present

## 2023-07-18 DIAGNOSIS — R634 Abnormal weight loss: Secondary | ICD-10-CM | POA: Diagnosis not present

## 2023-07-18 DIAGNOSIS — D692 Other nonthrombocytopenic purpura: Secondary | ICD-10-CM | POA: Diagnosis not present

## 2023-07-18 DIAGNOSIS — I2510 Atherosclerotic heart disease of native coronary artery without angina pectoris: Secondary | ICD-10-CM | POA: Diagnosis not present

## 2023-07-18 DIAGNOSIS — K117 Disturbances of salivary secretion: Secondary | ICD-10-CM | POA: Diagnosis not present

## 2023-07-18 DIAGNOSIS — I6529 Occlusion and stenosis of unspecified carotid artery: Secondary | ICD-10-CM | POA: Diagnosis not present

## 2023-07-18 DIAGNOSIS — E785 Hyperlipidemia, unspecified: Secondary | ICD-10-CM | POA: Diagnosis not present

## 2023-11-30 ENCOUNTER — Ambulatory Visit: Attending: Cardiovascular Disease | Admitting: Cardiovascular Disease

## 2023-11-30 ENCOUNTER — Encounter: Payer: Self-pay | Admitting: Cardiovascular Disease

## 2023-11-30 ENCOUNTER — Other Ambulatory Visit: Payer: Self-pay

## 2023-11-30 VITALS — BP 130/70 | HR 72 | Ht 68.0 in | Wt 142.0 lb

## 2023-11-30 DIAGNOSIS — I2510 Atherosclerotic heart disease of native coronary artery without angina pectoris: Secondary | ICD-10-CM | POA: Insufficient documentation

## 2023-11-30 DIAGNOSIS — E785 Hyperlipidemia, unspecified: Secondary | ICD-10-CM | POA: Insufficient documentation

## 2023-11-30 DIAGNOSIS — I10 Essential (primary) hypertension: Secondary | ICD-10-CM | POA: Diagnosis not present

## 2023-11-30 NOTE — Progress Notes (Signed)
 [name] Date of Birth  1934/07/04       [facility]       [place], [place]  [address]                Problem List: 1. Coronary artery disease-status post PTCA and stenting of his proximal LAD using a 3.5 x 23 mm and distal LAD stent using a 2.25 mm Taxus stent. [name] also status post stenting of the right coronary artery using a 3.0 x 15 mm Promus stent dilated with a 3.5 mm noncompliant balloon. 2. Hypertension 3. Hyperlipidemia   Brandon Roy is a 89 year old gentleman with a history of coronary artery disease. Status post PTCA and stenting of his proximal left anterior descending artery using a 3.5 mm x 23 mm stent. He also has a stent in his distal left anterior descending artery-2.25 mm Taxus stent) she's.  He is also status post PTCA and stenting of the right coronary artery using a 3.0 x 15 mm Primus stent post dilated using a 3.5 mm noncompliant balloon.  He also has a history of hypertension and hyperlipidemia. His pharmacy changed him to generic Diovan /HCT. Following this he developed an itchy rash. He went back to the Brand name Diovan  and feels much better.  January 02, 2013:  Brandon Roy is doing well.  No CP.  Staying busy.    01/05/2014:  Brandon Roy is doing ok.  Able to do all of his normal activities. [name] not having any chest pain.  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    February 04, 2016:  Staying busy.  No angina   Aug. 27, 2018:  Doing well. Staying active.   Going camping with grandson. Lipids are managed by Dr. Mamie Searles.    Oct. 2, 2019: Doing well.    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   Nov. 9, 2020   [place]and is seen today for follow up of his CAD Found to have severe carotid disease.   Now s/p L CEA . Has severe right carotid disease also .   No CP   Nov. 8, 2021: Brandon Roy is seen today for follow-up of his coronary artery disease.  He has severe carotid artery disease. He has an occluded  right carotid artery.  He status post left carotid endarterectomy.  [facility]  Has not had covid vaccine We discussed this  I think he should get the vaccine .   Labs from his primary medical doctor reveals a total cholesterol of 106 Triglyceride level is 61 HDL is 38 LDL is 56  Glucose level is 91, electrolytes are within normal limits.  White blood cell count is normal.  Hemoglobin is 13.9. PSA is 2.1.  Nov. 4, 2022 Brandon Roy is seen today for follow up of his CAD, Carotid artery disease, Still very active.  Lipid levels from Dr. Bethene Brought office from May, 2022 look good.  His LDL is well controlled.  Jan. 31, 2024 Brandon Roy is seen for follow up of his CAD , carotid artery disease  Is having some back issues , is working with rehab is is doing better.   No CP , no dyspnea  Has retired ( [employer] systems )   Labs from his primary medical doctor from June, 2023 reveals an  LDL of 65 HDL is 38  total cholesterol is 117 Triglyceride level  71    He has read about [email or web address]    Nov 30, 2023 Brandon Roy is seen for follow up of his CAD , carotid artery disease  Has been diagnosed with mysthemia gravis  Is now on Mestinon   Is doing better   No CP , no dyspnea   His last LDL was 81         Current Outpatient Medications on File Prior to Visit  Medication Sig Dispense Refill   Ascorbic Acid (VITAMIN C) 1000 MG tablet Take 1,000 mg by mouth daily.     aspirin  81 MG tablet Take 1 tablet (81 mg total) by mouth daily.     cholecalciferol (VITAMIN D3) 25 MCG (1000 UNIT) tablet Take 1,000 Units by mouth daily.     clopidogrel  (PLAVIX ) 75 MG tablet TAKE 1 TABLET (75 MG TOTAL) BY MOUTH DAILY. 90 tablet 2   ezetimibe  (ZETIA ) 10 MG tablet Take 1 tablet (10 mg total) by mouth daily. Please schedule an overdue appointment for future refills 90 tablet 3   fenofibrate  160 MG tablet TAKE 1 TABLET (160 MG TOTAL) BY MOUTH DAILY. 90 tablet 3    metoprolol  tartrate (LOPRESSOR ) 50 MG tablet Take 25 mg by mouth 2 (two) times daily.     predniSONE  (DELTASONE ) 5 MG tablet Take 1 tablet (5 mg total) by mouth daily with breakfast. 90 tablet 1   pyridostigmine (MESTINON) 60 MG tablet Take 60 mg by mouth 3 (three) times daily.     rosuvastatin  (CRESTOR ) 20 MG tablet Take 20 mg by mouth daily.     vitamin B-12 (CYANOCOBALAMIN ) 1000 MCG tablet Take 1,000 mcg by mouth at bedtime.      zinc gluconate 50 MG tablet Take 50 mg by mouth daily.     No current facility-administered medications on file prior to visit.    Allergies  Allergen Reactions   Cinnamon Itching   Lipitor [Atorvastatin Calcium ] Other (See Comments)    Reaction not recalled- just "didn't work well with me"   Shellfish Allergy Other (See Comments)   Tape Other (See Comments)   Valsartan -Hydrochlorothiazide  Itching    The generic version (Valsartan -HCTZ) caused patient to itch   Zocor [Simvastatin] Other (See Comments)    Reaction not recalled- just "didn't work well with me"    Past Medical History:  Diagnosis Date   CAD (coronary artery disease)    Status post recent PTCA and stenting of the proximal LAD (3.38mmx23mm Promus stent, post dilated using a 3.75 [place] sprinter) and the distal LAD   Carotid artery occlusion    HTN (hypertension)    Hyperlipidemia    Peripheral arterial disease (HCC)    Pneumonia 1941   Shingles    Wears dentures    Wears glasses     Past Surgical History:  Procedure Laterality Date   CARDIAC CATHETERIZATION  2000-2011   "6 caths and 3 stents total" (05/17/2018)   CAROTID ENDARTERECTOMY Left 05/17/2018   CATARACT EXTRACTION W/ INTRAOCULAR LENS  IMPLANT, BILATERAL Bilateral    COLONOSCOPY     CORONARY ANGIOPLASTY WITH STENT PLACEMENT  Sep 24, 1998   CORONARY ANGIOPLASTY WITH STENT PLACEMENT     Status post and stenting of his left circumflex artery    ENDARTERECTOMY Left 05/17/2018   Procedure: EXPLORATION OF NECK;  Surgeon: Margherita Shell, MD;  Location: [facility] OR;  Service: Vascular;  Laterality: Left;   ENDARTERECTOMY Left 05/17/2018   Procedure: LEFT ENDARTERECTOMY CAROTID;  Surgeon: Mayo Speck, MD;  Location: [facility] OR;  Service: Vascular;  Laterality: Left;   EYE SURGERY     HEMATOMA EVACUATION Left 05/17/2018   S/P carotid endarterectomy   MULTIPLE TOOTH EXTRACTIONS     PATCH ANGIOPLASTY Left 05/17/2018   Procedure: PATCH ANGIOPLASTY USING HEMASHIELD PLATINUM FINESSE;  Surgeon: Mayo Speck, MD;  Location: [facility] OR;  Service: Vascular;  Laterality: Left;   TONSILLECTOMY      Social History   Tobacco Use  Smoking Status Former   Types: Cigarettes  Smokeless Tobacco Never  Tobacco Comments   " Back in the 50's- 60's"    Social History   Substance and Sexual Activity  Alcohol  Use Yes   Comment: 05/17/2018 "might have up to 5 drinks/year"    Family History  Problem Relation Age of Onset   Heart attack Father    Heart disease Father     Reviw of Systems:  Reviewed in the HPI.  All other systems are negative.    Physical Exam: Blood pressure 130/70, pulse 72, height 5\' 8"  (1.727 m), weight 142 lb (64.4 kg), SpO2 97%.       GEN:  Well nourished, well developed in no acute distress HEENT: Normal NECK: No JVD; No carotid bruits LYMPHATICS: No lymphadenopathy CARDIAC: RRR , no murmurs, rubs, gallops RESPIRATORY:  Clear to auscultation without rales, wheezing or rhonchi  ABDOMEN: Soft, non-tender, non-distended MUSCULOSKELETAL:  No edema; No deformity  SKIN: Warm and dry NEUROLOGIC:  Alert and oriented x 3    ECG:     EKG Interpretation Date/Time:  Friday Nov 30 2023 14:44:07 EDT Ventricular Rate:  71 PR Interval:  232 QRS Duration:  88 QT Interval:  382 QTC Calculation: 415 R Axis:   23  Text Interpretation: Sinus rhythm with 1st degree A-V block When compared with ECG of 15-May-2011 05:15, No significant change was found Confirmed by Ahmad Alert ([id number]) on 11/30/2023 3:06:31 PM       Assessment / Plan:    1. Coronary artery disease-status post PTCA and stenting of his proximal LAD using a 3.5 x 23 mm and distal LAD stent using a 2.25 mm Taxus stent. [name] also status post stenting of the right coronary artery using a 3.0 x 15 mm Promus stent dilated with a 3.5 mm noncompliant balloon.  He denies any cp now   2. Hypertension -  well controlled     3. Hyperlipidemia -    His last LDL in June, 2024 was 81.  Continue zetia  .   Recheck labs today    4.   Carotid artery disease. :     Ahmad Alert, MD  11/30/2023 3:06 PM    [facility] [address] [place],  [address] [place], [place]  [address] Pager [phone] Phone: [phone]; Fax: [phone]

## 2023-11-30 NOTE — Patient Instructions (Signed)
 Medication Instructions:  Your physician recommends that you continue on your current medications as directed. Please refer to the Current Medication list given to you today.  *If you need a refill on your cardiac medications before your next appointment, please call your pharmacy*  Lab Work: Your physician recommends that you have lab work today on the 1st floor- lipid panel, ALT, and BMET  If you have labs (blood work) drawn today and your tests are completely normal, you will receive your results only by: MyChart Message (if you have MyChart) OR A paper copy in the mail If you have any lab test that is abnormal or we need to change your treatment, we will call you to review the results.   Follow-Up: At [facility], you and your health needs are our priority.  As part of our continuing mission to provide you with exceptional heart care, our providers are all part of one team.  This team includes your primary Cardiologist (physician) and Advanced Practice Providers or APPs (Physician Assistants and Nurse Practitioners) who all work together to provide you with the care you need, when you need it.  Your next appointment:   1 year(s)  Provider:   Any General Cardiologist taking new patients     We recommend signing up for the patient portal called "MyChart".  Sign up information is provided on this After Visit Summary.  MyChart is used to connect with patients for Virtual Visits (Telemedicine).  Patients are able to view lab/test results, encounter notes, upcoming appointments, etc.  Non-urgent messages can be sent to your provider as well.   To learn more about what you can do with MyChart, go to [email or web address].

## 2023-12-01 ENCOUNTER — Encounter: Payer: Self-pay | Admitting: Cardiovascular Disease

## 2023-12-01 LAB — LIPID PANEL
Chol/HDL Ratio: 2.9 ratio (ref 0.0–5.0)
Cholesterol, Total: 159 mg/dL (ref 100–199)
HDL: 55 mg/dL (ref >39)
LDL Chol Calc (NIH): 83 mg/dL (ref 0–99)
Triglycerides: 117 mg/dL (ref 0–149)
VLDL Cholesterol Cal: 21 mg/dL (ref 5–40)

## 2023-12-01 LAB — BASIC METABOLIC PANEL WITH GFR
BUN/Creatinine Ratio: 25 — ABNORMAL HIGH (ref 10–24)
BUN: 22 mg/dL (ref 8–27)
CO2: 23 mmol/L (ref 20–29)
Calcium: 10 mg/dL (ref 8.6–10.2)
Chloride: 105 mmol/L (ref 96–106)
Creatinine, Ser: 0.87 mg/dL (ref 0.76–1.27)
Glucose: 118 mg/dL — ABNORMAL HIGH (ref 70–99)
Potassium: 3.8 mmol/L (ref 3.5–5.2)
Sodium: 145 mmol/L — ABNORMAL HIGH (ref 134–144)
eGFR: 84 mL/min/{1.73_m2} (ref >59)

## 2023-12-01 LAB — ALT: ALT: 12 IU/L (ref 0–44)

## 2024-01-09 DIAGNOSIS — Z7952 Long term (current) use of systemic steroids: Secondary | ICD-10-CM | POA: Diagnosis not present

## 2024-01-09 DIAGNOSIS — I119 Hypertensive heart disease without heart failure: Secondary | ICD-10-CM | POA: Diagnosis not present

## 2024-01-09 DIAGNOSIS — K219 Gastro-esophageal reflux disease without esophagitis: Secondary | ICD-10-CM | POA: Diagnosis not present

## 2024-01-09 DIAGNOSIS — G7000 Myasthenia gravis without (acute) exacerbation: Secondary | ICD-10-CM | POA: Diagnosis not present

## 2024-01-09 DIAGNOSIS — R1310 Dysphagia, unspecified: Secondary | ICD-10-CM | POA: Diagnosis not present

## 2024-01-22 ENCOUNTER — Other Ambulatory Visit: Payer: Self-pay | Admitting: Cardiovascular Disease

## 2024-01-22 DIAGNOSIS — I10 Essential (primary) hypertension: Secondary | ICD-10-CM | POA: Diagnosis not present

## 2024-01-22 DIAGNOSIS — E538 Deficiency of other specified B group vitamins: Secondary | ICD-10-CM | POA: Diagnosis not present

## 2024-01-22 DIAGNOSIS — E785 Hyperlipidemia, unspecified: Secondary | ICD-10-CM | POA: Diagnosis not present

## 2024-01-22 DIAGNOSIS — E7849 Other hyperlipidemia: Secondary | ICD-10-CM | POA: Diagnosis not present

## 2024-01-22 DIAGNOSIS — I2510 Atherosclerotic heart disease of native coronary artery without angina pectoris: Secondary | ICD-10-CM | POA: Diagnosis not present

## 2024-01-22 DIAGNOSIS — R972 Elevated prostate specific antigen [PSA]: Secondary | ICD-10-CM | POA: Diagnosis not present

## 2024-01-22 DIAGNOSIS — E441 Mild protein-calorie malnutrition: Secondary | ICD-10-CM | POA: Diagnosis not present

## 2024-01-29 DIAGNOSIS — E785 Hyperlipidemia, unspecified: Secondary | ICD-10-CM | POA: Diagnosis not present

## 2024-01-29 DIAGNOSIS — E441 Mild protein-calorie malnutrition: Secondary | ICD-10-CM | POA: Diagnosis not present

## 2024-01-29 DIAGNOSIS — L89152 Pressure ulcer of sacral region, stage 2: Secondary | ICD-10-CM | POA: Diagnosis not present

## 2024-01-29 DIAGNOSIS — I119 Hypertensive heart disease without heart failure: Secondary | ICD-10-CM | POA: Diagnosis not present

## 2024-01-29 DIAGNOSIS — R634 Abnormal weight loss: Secondary | ICD-10-CM | POA: Diagnosis not present

## 2024-01-29 DIAGNOSIS — M1990 Unspecified osteoarthritis, unspecified site: Secondary | ICD-10-CM | POA: Diagnosis not present

## 2024-01-29 DIAGNOSIS — D692 Other nonthrombocytopenic purpura: Secondary | ICD-10-CM | POA: Diagnosis not present

## 2024-01-29 DIAGNOSIS — G7000 Myasthenia gravis without (acute) exacerbation: Secondary | ICD-10-CM | POA: Diagnosis not present

## 2024-01-29 DIAGNOSIS — E538 Deficiency of other specified B group vitamins: Secondary | ICD-10-CM | POA: Diagnosis not present

## 2024-01-29 DIAGNOSIS — K117 Disturbances of salivary secretion: Secondary | ICD-10-CM | POA: Diagnosis not present

## 2024-01-29 DIAGNOSIS — Z1331 Encounter for screening for depression: Secondary | ICD-10-CM | POA: Diagnosis not present

## 2024-01-29 DIAGNOSIS — I6529 Occlusion and stenosis of unspecified carotid artery: Secondary | ICD-10-CM | POA: Diagnosis not present

## 2024-01-29 DIAGNOSIS — H3530 Unspecified macular degeneration: Secondary | ICD-10-CM | POA: Diagnosis not present

## 2024-01-29 DIAGNOSIS — R972 Elevated prostate specific antigen [PSA]: Secondary | ICD-10-CM | POA: Diagnosis not present

## 2024-01-29 DIAGNOSIS — Z0000 Encounter for general adult medical examination without abnormal findings: Secondary | ICD-10-CM | POA: Diagnosis not present

## 2024-01-29 DIAGNOSIS — Z1389 Encounter for screening for other disorder: Secondary | ICD-10-CM | POA: Diagnosis not present

## 2024-01-29 DIAGNOSIS — H02409 Unspecified ptosis of unspecified eyelid: Secondary | ICD-10-CM | POA: Diagnosis not present

## 2024-01-29 DIAGNOSIS — Z1339 Encounter for screening examination for other mental health and behavioral disorders: Secondary | ICD-10-CM | POA: Diagnosis not present

## 2024-01-29 DIAGNOSIS — R531 Weakness: Secondary | ICD-10-CM | POA: Diagnosis not present

## 2024-01-29 DIAGNOSIS — R471 Dysarthria and anarthria: Secondary | ICD-10-CM | POA: Diagnosis not present

## 2024-01-29 DIAGNOSIS — Z7952 Long term (current) use of systemic steroids: Secondary | ICD-10-CM | POA: Diagnosis not present

## 2024-01-29 DIAGNOSIS — K219 Gastro-esophageal reflux disease without esophagitis: Secondary | ICD-10-CM | POA: Diagnosis not present

## 2024-01-29 DIAGNOSIS — I2510 Atherosclerotic heart disease of native coronary artery without angina pectoris: Secondary | ICD-10-CM | POA: Diagnosis not present

## 2024-01-29 DIAGNOSIS — I8393 Asymptomatic varicose veins of bilateral lower extremities: Secondary | ICD-10-CM | POA: Diagnosis not present

## 2024-01-29 DIAGNOSIS — M545 Low back pain, unspecified: Secondary | ICD-10-CM | POA: Diagnosis not present

## 2024-01-29 DIAGNOSIS — I700 Atherosclerosis of aorta: Secondary | ICD-10-CM | POA: Diagnosis not present

## 2024-03-27 DIAGNOSIS — R972 Elevated prostate specific antigen [PSA]: Secondary | ICD-10-CM | POA: Diagnosis not present

## 2024-03-27 DIAGNOSIS — N400 Enlarged prostate without lower urinary tract symptoms: Secondary | ICD-10-CM | POA: Diagnosis not present

## 2024-05-10 ENCOUNTER — Emergency Department (HOSPITAL_COMMUNITY)

## 2024-05-10 ENCOUNTER — Other Ambulatory Visit: Payer: Self-pay

## 2024-05-10 ENCOUNTER — Emergency Department (HOSPITAL_BASED_OUTPATIENT_CLINIC_OR_DEPARTMENT_OTHER): Admit: 2024-05-10 | Discharge: 2024-05-10 | Disposition: A | Discharge status: Home / Self Care

## 2024-05-10 ENCOUNTER — Observation Stay (HOSPITAL_COMMUNITY)
Admission: EM | Admit: 2024-05-10 | Discharge: 2024-05-11 | Disposition: A | Discharge status: Home / Self Care | Attending: Internal Medicine | Admitting: Internal Medicine

## 2024-05-10 ENCOUNTER — Observation Stay (HOSPITAL_COMMUNITY)

## 2024-05-10 ENCOUNTER — Encounter (HOSPITAL_COMMUNITY): Payer: Self-pay | Admitting: Internal Medicine

## 2024-05-10 DIAGNOSIS — Z79899 Other long term (current) drug therapy: Secondary | ICD-10-CM | POA: Insufficient documentation

## 2024-05-10 DIAGNOSIS — Z87891 Personal history of nicotine dependence: Secondary | ICD-10-CM | POA: Insufficient documentation

## 2024-05-10 DIAGNOSIS — R2242 Localized swelling, mass and lump, left lower limb: Secondary | ICD-10-CM | POA: Diagnosis not present

## 2024-05-10 DIAGNOSIS — N433 Hydrocele, unspecified: Secondary | ICD-10-CM | POA: Diagnosis not present

## 2024-05-10 DIAGNOSIS — K8020 Calculus of gallbladder without cholecystitis without obstruction: Secondary | ICD-10-CM | POA: Diagnosis not present

## 2024-05-10 DIAGNOSIS — I10 Essential (primary) hypertension: Secondary | ICD-10-CM | POA: Diagnosis not present

## 2024-05-10 DIAGNOSIS — M7989 Other specified soft tissue disorders: Principal | ICD-10-CM

## 2024-05-10 DIAGNOSIS — N400 Enlarged prostate without lower urinary tract symptoms: Secondary | ICD-10-CM | POA: Diagnosis not present

## 2024-05-10 DIAGNOSIS — Z7902 Long term (current) use of antithrombotics/antiplatelets: Secondary | ICD-10-CM | POA: Insufficient documentation

## 2024-05-10 DIAGNOSIS — E785 Hyperlipidemia, unspecified: Secondary | ICD-10-CM | POA: Diagnosis present

## 2024-05-10 DIAGNOSIS — I2510 Atherosclerotic heart disease of native coronary artery without angina pectoris: Secondary | ICD-10-CM | POA: Diagnosis not present

## 2024-05-10 DIAGNOSIS — Z7982 Long term (current) use of aspirin: Secondary | ICD-10-CM | POA: Diagnosis not present

## 2024-05-10 DIAGNOSIS — M25452 Effusion, left hip: Principal | ICD-10-CM | POA: Diagnosis present

## 2024-05-10 DIAGNOSIS — G7000 Myasthenia gravis without (acute) exacerbation: Secondary | ICD-10-CM | POA: Diagnosis not present

## 2024-05-10 DIAGNOSIS — K5730 Diverticulosis of large intestine without perforation or abscess without bleeding: Secondary | ICD-10-CM | POA: Diagnosis not present

## 2024-05-10 DIAGNOSIS — K5900 Constipation, unspecified: Secondary | ICD-10-CM | POA: Diagnosis not present

## 2024-05-10 LAB — CK: Total CK: 58 U/L (ref 49–397)

## 2024-05-10 LAB — CBC WITH DIFFERENTIAL/PLATELET
Abs Immature Granulocytes: 0.05 K/uL (ref 0.00–0.07)
Basophils Absolute: 0 K/uL (ref 0.0–0.1)
Basophils Relative: 0 %
Eosinophils Absolute: 0 K/uL (ref 0.0–0.5)
Eosinophils Relative: 0 %
HCT: 37.3 % — ABNORMAL LOW (ref 39.0–52.0)
Hemoglobin: 12.1 g/dL — ABNORMAL LOW (ref 13.0–17.0)
Immature Granulocytes: 0 %
Lymphocytes Relative: 6 %
Lymphs Abs: 0.8 K/uL (ref 0.7–4.0)
MCH: 30.6 pg (ref 26.0–34.0)
MCHC: 32.4 g/dL (ref 30.0–36.0)
MCV: 94.4 fL (ref 80.0–100.0)
Monocytes Absolute: 1.1 K/uL — ABNORMAL HIGH (ref 0.1–1.0)
Monocytes Relative: 8 %
Neutro Abs: 11.7 K/uL — ABNORMAL HIGH (ref 1.7–7.7)
Neutrophils Relative %: 86 %
Platelets: 282 K/uL (ref 150–400)
RBC: 3.95 MIL/uL — ABNORMAL LOW (ref 4.22–5.81)
RDW: 13.2 % (ref 11.5–15.5)
WBC: 13.7 K/uL — ABNORMAL HIGH (ref 4.0–10.5)
nRBC: 0 % (ref 0.0–0.2)

## 2024-05-10 LAB — BASIC METABOLIC PANEL WITH GFR
Anion gap: 12 (ref 5–15)
BUN: 24 mg/dL — ABNORMAL HIGH (ref 8–23)
CO2: 23 mmol/L (ref 22–32)
Calcium: 9.3 mg/dL (ref 8.9–10.3)
Chloride: 105 mmol/L (ref 98–111)
Creatinine, Ser: 0.68 mg/dL (ref 0.61–1.24)
GFR, Estimated: >60 mL/min (ref >60)
Glucose, Bld: 112 mg/dL — ABNORMAL HIGH (ref 70–99)
Potassium: 4 mmol/L (ref 3.5–5.1)
Sodium: 141 mmol/L (ref 135–145)

## 2024-05-10 LAB — C-REACTIVE PROTEIN: CRP: 1.4 mg/dL — ABNORMAL HIGH (ref <1.0)

## 2024-05-10 LAB — SEDIMENTATION RATE: Sed Rate: 15 mm/h (ref 0–16)

## 2024-05-10 MED ORDER — ONDANSETRON HCL 4 MG PO TABS: 4.0000 mg | ORAL_TABLET | Freq: Four times a day (QID) | ORAL | Status: DC | PRN

## 2024-05-10 MED ORDER — ACETAMINOPHEN 650 MG RE SUPP: 650.0000 mg | Freq: Four times a day (QID) | RECTAL | Status: DC | PRN

## 2024-05-10 MED ORDER — METOPROLOL TARTRATE 25 MG PO TABS
25.0000 mg | ORAL_TABLET | Freq: Two times a day (BID) | ORAL | Status: DC
  Administered 2024-05-11: 25 mg via ORAL
  Filled 2024-05-10: qty 1

## 2024-05-10 MED ORDER — ACETAMINOPHEN 325 MG PO TABS: 650.0000 mg | ORAL_TABLET | Freq: Four times a day (QID) | ORAL | Status: DC | PRN

## 2024-05-10 MED ORDER — ONDANSETRON HCL 4 MG/2ML IJ SOLN
4.0000 mg | Freq: Four times a day (QID) | INTRAMUSCULAR | Status: DC | PRN
Start: 2024-05-10 — End: 2024-05-11

## 2024-05-10 MED ORDER — SENNOSIDES-DOCUSATE SODIUM 8.6-50 MG PO TABS: 1.0000 | ORAL_TABLET | Freq: Every evening | ORAL | Status: DC | PRN

## 2024-05-10 MED ORDER — PYRIDOSTIGMINE BROMIDE 60 MG PO TABS
60.0000 mg | ORAL_TABLET | Freq: Three times a day (TID) | ORAL | Status: DC
  Administered 2024-05-11: 60 mg via ORAL
  Filled 2024-05-10: qty 1

## 2024-05-10 MED ORDER — SODIUM CHLORIDE 0.9 % IV SOLN
1.0000 g | Freq: Once | INTRAVENOUS | Status: AC
  Administered 2024-05-10: 1 g via INTRAVENOUS
  Filled 2024-05-10: qty 10

## 2024-05-10 MED ORDER — ENSURE PLUS HIGH PROTEIN PO LIQD
237.0000 mL | Freq: Two times a day (BID) | ORAL | Status: DC
  Administered 2024-05-11: 237 mL via ORAL

## 2024-05-10 MED ORDER — VANCOMYCIN HCL IN DEXTROSE 1-5 GM/200ML-% IV SOLN
1000.0000 mg | Freq: Once | INTRAVENOUS | Status: AC
  Administered 2024-05-10: 1000 mg via INTRAVENOUS
  Filled 2024-05-10: qty 200

## 2024-05-10 MED ORDER — IOHEXOL 350 MG/ML SOLN
100.0000 mL | Freq: Once | INTRAVENOUS | Status: AC | PRN
  Administered 2024-05-10: 100 mL via INTRAVENOUS

## 2024-05-10 NOTE — ED Provider Notes (Signed)
 [place] [facility] AT [facility] Provider Note   CSN: [id number] Arrival date & time: 05/10/24  1256     Patient presents with: Leg Pain   Brandon Roy is a 89 y.o. male.  Past medical history significant for CAD, hypertension, pneumonia, rotted artery occlusion and hyperlipidemia presents today for left leg swelling.  Patient reports he mowed grass on Thursday and woke up Friday and his leg was fine, however later that day he tried to get up off the couch and could not bear weight on it.  Patient reports left upper thigh swelling.  Patient denies erythema, warmth, fever, chills, trauma, or any other complaints at this time.  Patient is anticoagulated on Plavix .  Patient denies any missed doses.    Leg Pain      Prior to Admission medications   Medication Sig Start Date End Date Taking? Authorizing Provider  Ascorbic Acid (VITAMIN C) 1000 MG tablet Take 1,000 mg by mouth daily.    [provider]  aspirin  81 MG tablet Take 1 tablet (81 mg total) by mouth daily. 01/05/14   Nahser, Aleene PARAS, MD  cholecalciferol (VITAMIN D3) 25 MCG (1000 UNIT) tablet Take 1,000 Units by mouth daily.    [provider]  clopidogrel  (PLAVIX ) 75 MG tablet TAKE 1 TABLET (75 MG TOTAL) BY MOUTH DAILY. 06/28/15   Nahser, Aleene PARAS, MD  ezetimibe  (ZETIA ) 10 MG tablet Take 1 tablet (10 mg total) by mouth daily. Please schedule an overdue appointment for future refills 01/23/24   Nahser, Aleene PARAS, MD  fenofibrate  160 MG tablet TAKE 1 TABLET (160 MG TOTAL) BY MOUTH DAILY. 05/31/15   Nahser, Aleene PARAS, MD  metoprolol  tartrate (LOPRESSOR ) 50 MG tablet Take 25 mg by mouth 2 (two) times daily.    [provider]  predniSONE  (DELTASONE ) 5 MG tablet Take 1 tablet (5 mg total) by mouth daily with breakfast. 05/09/23   Patel, Donika K, DO  pyridostigmine (MESTINON) 60 MG tablet Take 60 mg by mouth 3 (three) times daily.    [provider]  rosuvastatin  (CRESTOR ) 20 MG  tablet Take 20 mg by mouth daily.    [provider]  vitamin B-12 (CYANOCOBALAMIN ) 1000 MCG tablet Take 1,000 mcg by mouth at bedtime.     [provider]  zinc gluconate 50 MG tablet Take 50 mg by mouth daily.    [provider]    Allergies: Cinnamon, Lipitor [atorvastatin calcium ], Shellfish allergy, Tape, Valsartan -hydrochlorothiazide , and Zocor [simvastatin]    Review of Systems  Cardiovascular:  Positive for leg swelling.    Updated Vital Signs BP 123/70   Pulse 89   Temp 98.5 F (36.9 C) (Oral)   Resp 16   Ht 5' 8 (1.727 m)   Wt 59 kg   SpO2 98%   BMI 19.77 kg/m   Physical Exam  (all labs ordered are listed, but only abnormal results are displayed) Labs Reviewed  CBC WITH DIFFERENTIAL/PLATELET - Abnormal; Notable for the following components:      Result Value   WBC 13.7 (*)    RBC 3.95 (*)    Hemoglobin 12.1 (*)    HCT 37.3 (*)    Neutro Abs 11.7 (*)    Monocytes Absolute 1.1 (*)    All other components within normal limits  BASIC METABOLIC PANEL WITH GFR - Abnormal; Notable for the following components:   Glucose, Bld 112 (*)    BUN 24 (*)    All other  components within normal limits  SEDIMENTATION RATE  C-REACTIVE PROTEIN    EKG: None  Radiology: CT VENOGRAM ABD/PELVIS/LOWER EXT BILAT Result Date: 05/10/2024 CLINICAL DATA:  Right thigh swelling. EXAM: CT VENOGRAM ABDOMEN AND PELVIS AND LOWER EXTREMITY BILATERAL TECHNIQUE: Venographic phase images of the abdomen, pelvis and lower extremities were obtained following the administration of intravenous contrast. Multiplanar reformats and maximum intensity projections were generated. RADIATION DOSE REDUCTION: This exam was performed according to the departmental dose-optimization program which includes automated exposure control, adjustment of the mA and/or kV according to patient size and/or use of iterative reconstruction technique. CONTRAST:  OMNIPAQUE  IOHEXOL  350 MG/ML SOLN  COMPARISON:  10/10/2022. FINDINGS: Lower chest: No acute abnormality. Multi-vessel coronary artery calcifications are noted. Hepatobiliary: No focal abnormality in the liver. Fatty infiltration of the liver is noted. A stone is seen in the gallbladder. No biliary ductal dilatation. Pancreas: Unremarkable. No pancreatic ductal dilatation or surrounding inflammatory changes. Spleen: Normal in size without focal abnormality. Adrenals/Urinary Tract: The adrenal glands are within normal limits. The kidneys enhance symmetrically. No renal calculus or obstructive uropathy bilaterally. The bladder is unremarkable. Stomach/Bowel: The stomach is within normal limits. No bowel obstruction, free air, or pneumatosis is seen. Appendix appears normal. Scattered diverticula are noted along the colon without evidence of diverticulitis. A moderate amount of retained stool is noted in the colon. Vascular/Lymphatic: Aortic atherosclerosis. No enlarged abdominal or pelvic lymph nodes. Reproductive: Prostate gland is enlarged. Other: No abdominopelvic ascites. There is a large left hydrocele containing a 3 cm nodular density. Musculoskeletal: Degenerative changes are present in the thoracolumbar spine. No acute osseous abnormality. IVC: No evidence for thrombus or stenosis. Portal and mesenteric veins: No evidence for thrombus or stenosis. Bilateral iliac veins: No evidence for thrombus or stenosis. Right lower extremity: The femoral and popliteal veins opacify heterogeneously, possibly related to mixing artifact. The possibility of underlying thrombosis cannot be completely excluded. Left lower extremity: The femoral and popliteal veins opacify heterogeneously and the possibility of underlying DVT cannot be completely excluded. There is diffuse subcutaneous edema and asymmetric enlargement of the quadriceps muscles involving in the left lower extremity. Intramuscular edema and fat stranding is also noted. There is a complex collection  over the greater trochanter on the left with possible septation measuring 8.3 x 5.2 x 3.5 cm. IMPRESSION: 1. Suboptimal opacification of the bilateral femoral and popliteal veins bilaterally. The possibility of underlying deep venous thrombosis cannot be excluded. 2. Diffuse subcutaneous swelling with asymmetric enlargement of the quadriceps muscle on the left, which may be infectious or inflammatory. Complex collection adjacent to the greater trochanter, possible bursitis versus infection or resolving hematoma. Consider MRI for further evaluation. 3. Large left hydrocele containing a 3 cm nodular component. Ultrasound is recommended for further evaluation. 4. Enlarged prostate gland. 5. Hepatic steatosis. 6. Diverticulosis without diverticulitis. 7. Coronary artery calcifications and aortic atherosclerosis. Electronically Signed   By: Leita Birmingham M.D.   On: 05/10/2024 19:06   VAS US  LOWER EXTREMITY VENOUS (DVT) (7a-7p) Result Date: 05/10/2024  Lower Venous DVT Study Patient Name:  Brandon Roy  Date of Exam:   05/10/2024 Medical Rec #: [id number]     Accession #:    [id number] Date of Birth: [birth year]     Patient Gender: M Patient Age:   45 years Exam Location:  [facility] Procedure:      VAS US  LOWER EXTREMITY VENOUS (DVT) Referring Phys: ILEANA ECK --------------------------------------------------------------------------------  Indications: Swelling.  Risk Factors: None identified. Comparison Study: No  prior studies. Performing Technologist: Cordella Collet RVT  Examination Guidelines: A complete evaluation includes B-mode imaging, spectral Doppler, color Doppler, and power Doppler as needed of all accessible portions of each vessel. Bilateral testing is considered an integral part of a complete examination. Limited examinations for reoccurring indications may be performed as noted. The reflux portion of the exam is performed with the patient in reverse Trendelenburg.   +-----+---------------+---------+-----------+----------+--------------+ RIGHTCompressibilityPhasicitySpontaneityPropertiesThrombus Aging +-----+---------------+---------+-----------+----------+--------------+ CFV  Full           Yes      Yes                                 +-----+---------------+---------+-----------+----------+--------------+   +---------+---------------+---------+-----------+----------+--------------+ LEFT     CompressibilityPhasicitySpontaneityPropertiesThrombus Aging +---------+---------------+---------+-----------+----------+--------------+ CFV      Full           Yes      Yes                                 +---------+---------------+---------+-----------+----------+--------------+ SFJ      Full                                                        +---------+---------------+---------+-----------+----------+--------------+ FV Prox  Full                                                        +---------+---------------+---------+-----------+----------+--------------+ FV Mid   Full                                                        +---------+---------------+---------+-----------+----------+--------------+ FV DistalFull                                                        +---------+---------------+---------+-----------+----------+--------------+ PFV      Full                                                        +---------+---------------+---------+-----------+----------+--------------+ POP      Full           Yes      Yes                                 +---------+---------------+---------+-----------+----------+--------------+ PTV      Full                                                        +---------+---------------+---------+-----------+----------+--------------+  PERO     Full                                                         +---------+---------------+---------+-----------+----------+--------------+     Summary: RIGHT: - No evidence of common femoral vein obstruction.   LEFT: - There is no evidence of deep vein thrombosis in the lower extremity.  - No cystic structure found in the popliteal fossa.  *See table(s) above for measurements and observations.    Preliminary      Procedures   Medications Ordered in the ED  vancomycin (VANCOCIN) IVPB 1000 mg/200 mL premix (1,000 mg Intravenous New Bag/Given 05/10/24 2020)  iohexol  (OMNIPAQUE ) 350 MG/ML injection 100 mL (100 mLs Intravenous Contrast Given 05/10/24 1757)  cefTRIAXone (ROCEPHIN) 1 g in sodium chloride  0.9 % 100 mL IVPB (0 g Intravenous Stopped 05/10/24 2020)                                    Medical Decision Making Amount and/or Complexity of Data Reviewed Labs: ordered. Radiology: ordered.  Risk Prescription drug management.   This patient presents to the ED for concern of L thigh swelling differential diagnosis includes DVT, hematoma, hemarthrosis, dependent edema  Lab Tests:  I Ordered, and personally interpreted labs.  The pertinent results include: Leukocytosis at 13.7, mildly elevated bun at 24   Imaging Studies ordered:  I ordered imaging studies including left leg DVT study I independently visualized and interpreted imaging which showed negative I agree with the radiologist interpretation CT venogram which showed complex collection over the greater trochanter on the left with possible septation measuring 8.3 x 5.2 x 3.5 cm   Medicines ordered and prescription drug management:  I ordered medication including Rocephin and vancomycin    I have reviewed the patients home medicines and have made adjustments as needed   Problem List / ED Course: Consulted orthopedic surgery, Dr. Jerri who is agreeable to see the patient while admitted Consulted Hospitalist, Dr. Tobie who is agreeable to admission.       Final diagnoses:  Left leg  swelling    ED Discharge Orders     None          Francis Ileana LOISE DEVONNA 05/10/24 2047    Melvenia Motto, MD 05/10/24 [phone]

## 2024-05-10 NOTE — ED Triage Notes (Signed)
 Patient c/o left upper leg pain starting Friday Says he mowed grass Thursday. Woke up Friday and leg was fine. Later that day he got up off couch and couldn't walk on it Says leg is swelling, denies redness Pain rated 10 yesterday but 1 today

## 2024-05-10 NOTE — Progress Notes (Signed)
 Left lower extremity venous duplex has been completed. Preliminary results can be found in CV Proc through chart review.  Results were given to Dr. Melvenia.  05/10/24 3:07 PM Cathlyn Collet RVT

## 2024-05-10 NOTE — Plan of Care (Signed)
  Problem: Coping: Goal: Level of anxiety will decrease Outcome: Progressing   Problem: Activity: Goal: Risk for activity intolerance will decrease Outcome: Progressing   Problem: Pain Managment: Goal: General experience of comfort will improve and/or be controlled Outcome: Progressing

## 2024-05-10 NOTE — Progress Notes (Signed)
 Pharmacy Note   A consult was received from an ED physician for vancomycin per pharmacy dosing.    The patient's profile has been reviewed for ht/wt/allergies/indication/available labs.    A one time order has been placed for vancomycin 1000 mg IV x 1 .    Further antibiotics/pharmacy consults should be ordered by admitting physician if indicated.                       Thank you,  Desmen Schoffstall, PharmD, BCPS 05/10/2024 7:35 PM

## 2024-05-10 NOTE — Hospital Course (Signed)
 Brandon Roy is a 89 y.o. male with medical history significant for CAD s/p PCI, HTN, HLD, CAS s/p left CEA 2019, myasthenia gravis on Mestinon who is admitted for evaluation of nonspecific left quadriceps swelling and complex collection adjacent to left greater trochanter.  Apparently his symptoms started after mowing grass.  Patient was on chronic steroid for a long time and recently tapered off about 1.5 weeks ago.  On presentation hemodynamically stable, labs with some leukocytosis at 13.7, rest of the labs stable.  CT venogram abdomen/pelvis/lower extremities IMPRESSION: 1. Suboptimal opacification of the bilateral femoral and popliteal veins bilaterally. The possibility of underlying deep venous thrombosis cannot be excluded. 2. Diffuse subcutaneous swelling with asymmetric enlargement of the quadriceps muscle on the left, which may be infectious or inflammatory. Complex collection adjacent to the greater trochanter, possible bursitis versus infection or resolving hematoma. Consider MRI for further evaluation. 3. Large left hydrocele containing a 3 cm nodular component. Ultrasound is recommended for further evaluation. 4. Enlarged prostate gland. 5. Hepatic steatosis. 6. Diverticulosis without diverticulitis. 7. Coronary artery calcifications and aortic atherosclerosis.  Lower extremity venous Doppler was negative for DVT.  Ultrasound scrotum ordered.  Orthopedic surgery was consulted.  10/12: Vital stable, improving leukocytosis now at 10.6, CRP mildly elevated at 1.4, CK normal. Scrotal ultrasound with no testicular abnormality, large complex left hydrocele which need to be followed up with urology as outpatient.  Orthopedic surgery evaluated him, likely overuse injury versus severe trochanteric bursitis and they were recommending PT and OT evaluation and outpatient follow-up.  Orthopedic surgery will decide about further imaging if needed as outpatient.  Patient will continue  with his home medications and need to have a close follow-up with his providers for further assistance.

## 2024-05-10 NOTE — H&P (Signed)
 History and Physical    Brandon Roy FMW:[id number] DOB: 1933-10-11 DOA: 05/10/2024  PCP: Onita Rush, MD  Patient coming from: Home  I have personally briefly reviewed patient's old medical records in [facility]  Chief Complaint: Left thigh swelling and pain  HPI: Brandon Roy is a 89 y.o. male with medical history significant for CAD s/p PCI, HTN, HLD, CAS s/p left CEA 2019, myasthenia gravis on Mestinon who presented to the ED for evaluation of left upper thigh swelling.  Patient states that he mowed the grass on Thursday.  He was at his usual state of health when he went to bed.  Friday morning he was also feeling well.  Later in the day he got up off the couch and noticed swelling and pain to his left upper thigh and quadriceps area.  He was having some difficulty walking and has been using a crutch from a prior injury.  He slept with a heating pad to the area overnight.  He says pain is improved but swelling has persisted and still limiting his mobility therefore he came to the ED for further evaluation.  He denies any injury to the area.  He has not seen any overlying bruising.  He denies fevers, chills, diaphoresis.  Of note patient was previously on chronic prednisone  but he states that dose was recently decreased and he ultimately discontinued it about 1.5 weeks ago.  He remains on aspirin  and Plavix .  ED Course  Labs/Imaging on admission: I have personally reviewed following labs and imaging studies.  Initial vitals showed BP 137/73, pulse 97, RR 16, temp 98.5 F, SpO2 100% on room air.  Labs showed WBC 13.7, hemoglobin 12.1, platelets 282, sodium 141, potassium 4.0, bicarb 23, BUN 24, creatinine 0.68, serum glucose 112.  ESR and CRP in process.  CT venogram abdomen/pelvis/lower extremities IMPRESSION: 1. Suboptimal opacification of the bilateral femoral and popliteal veins bilaterally. The possibility of underlying deep venous thrombosis cannot be excluded. 2.  Diffuse subcutaneous swelling with asymmetric enlargement of the quadriceps muscle on the left, which may be infectious or inflammatory. Complex collection adjacent to the greater trochanter, possible bursitis versus infection or resolving hematoma. Consider MRI for further evaluation. 3. Large left hydrocele containing a 3 cm nodular component. Ultrasound is recommended for further evaluation. 4. Enlarged prostate gland. 5. Hepatic steatosis. 6. Diverticulosis without diverticulitis. 7. Coronary artery calcifications and aortic atherosclerosis.  MRI left femur W/Wo ordered and pending.  Left lower extremity venous ultrasound preliminary read is negative for DVT.  Patient was given IV vancomycin and ceftriaxone.  EDP discussed with on-call orthopedics (Dr. Jerri) who will see in consultation.  The hospitalist service was consulted for admission.  Review of Systems: All systems reviewed and are negative except as documented in history of present illness above.   Past Medical History:  Diagnosis Date   CAD (coronary artery disease)    Status post recent PTCA and stenting of the proximal LAD (3.83mmx23mm Promus stent, post dilated using a 3.75 [place] sprinter) and the distal LAD   Carotid artery occlusion    HTN (hypertension)    Hyperlipidemia    Peripheral arterial disease    Pneumonia 1941   Shingles    Wears dentures    Wears glasses     Past Surgical History:  Procedure Laterality Date   CARDIAC CATHETERIZATION  2000-2011   6 caths and 3 stents total (05/17/2018)   CAROTID ENDARTERECTOMY Left 05/17/2018   CATARACT EXTRACTION W/ INTRAOCULAR LENS  IMPLANT, BILATERAL Bilateral    COLONOSCOPY     CORONARY ANGIOPLASTY WITH STENT PLACEMENT  Sep 24, 1998   CORONARY ANGIOPLASTY WITH STENT PLACEMENT     Status post and stenting of his left circumflex artery    ENDARTERECTOMY Left 05/17/2018   Procedure: EXPLORATION OF NECK;  Surgeon: Serene Gaile ORN, MD;  Location: [facility] OR;  Service:  Vascular;  Laterality: Left;   ENDARTERECTOMY Left 05/17/2018   Procedure: LEFT ENDARTERECTOMY CAROTID;  Surgeon: Oris Krystal FALCON, MD;  Location: [facility] OR;  Service: Vascular;  Laterality: Left;   EYE SURGERY     HEMATOMA EVACUATION Left 05/17/2018   S/P carotid endarterectomy   MULTIPLE TOOTH EXTRACTIONS     PATCH ANGIOPLASTY Left 05/17/2018   Procedure: PATCH ANGIOPLASTY USING HEMASHIELD PLATINUM FINESSE;  Surgeon: Oris Krystal FALCON, MD;  Location: [facility] OR;  Service: Vascular;  Laterality: Left;   TONSILLECTOMY      Social History: Social History   Tobacco Use   Smoking status: Former    Types: Cigarettes   Smokeless tobacco: Never   Tobacco comments:     Back in the 50's- 60's  Vaping Use   Vaping status: Never Used  Substance Use Topics   Alcohol  use: Yes    Comment: 05/17/2018 might have up to 5 drinks/year   Drug use: Never   Allergies  Allergen Reactions   Cinnamon Itching   Lipitor [Atorvastatin Calcium ] Other (See Comments)    Reaction not recalled- just didn't work well with me   Shellfish Allergy Other (See Comments)   Tape Other (See Comments)   Valsartan -Hydrochlorothiazide  Itching    The generic version (Valsartan -HCTZ) caused patient to itch   Zocor [Simvastatin] Other (See Comments)    Reaction not recalled- just didn't work well with me    Family History  Problem Relation Age of Onset   Heart attack Father    Heart disease Father      Prior to Admission medications   Medication Sig Start Date End Date Taking? Authorizing Provider  Ascorbic Acid (VITAMIN C) 1000 MG tablet Take 1,000 mg by mouth daily.    [provider]  aspirin  81 MG tablet Take 1 tablet (81 mg total) by mouth daily. 01/05/14   Nahser, Aleene PARAS, MD  cholecalciferol (VITAMIN D3) 25 MCG (1000 UNIT) tablet Take 1,000 Units by mouth daily.    [provider]  clopidogrel  (PLAVIX ) 75 MG tablet TAKE 1 TABLET (75 MG TOTAL) BY MOUTH DAILY. 06/28/15   Nahser, Aleene PARAS, MD   ezetimibe  (ZETIA ) 10 MG tablet Take 1 tablet (10 mg total) by mouth daily. Please schedule an overdue appointment for future refills 01/23/24   Nahser, Aleene PARAS, MD  fenofibrate  160 MG tablet TAKE 1 TABLET (160 MG TOTAL) BY MOUTH DAILY. 05/31/15   Nahser, Aleene PARAS, MD  metoprolol  tartrate (LOPRESSOR ) 50 MG tablet Take 25 mg by mouth 2 (two) times daily.    [provider]  predniSONE  (DELTASONE ) 5 MG tablet Take 1 tablet (5 mg total) by mouth daily with breakfast. 05/09/23   Rollande Thursby, Donika K, DO  pyridostigmine (MESTINON) 60 MG tablet Take 60 mg by mouth 3 (three) times daily.    [provider]  rosuvastatin  (CRESTOR ) 20 MG tablet Take 20 mg by mouth daily.    [provider]  vitamin B-12 (CYANOCOBALAMIN ) 1000 MCG tablet Take 1,000 mcg by mouth at bedtime.     [provider]  zinc gluconate 50 MG tablet Take 50 mg by  mouth daily.    [provider]    Physical Exam: Vitals:   05/10/24 1326 05/10/24 2025 05/10/24 2030 05/10/24 2100  BP: 137/73 123/70 112/77   Pulse: 97 89 89 95  Resp: 16 16 10  (!) 22  Temp: 98.5 F (36.9 C)  97.8 F (36.6 C)   TempSrc: Oral  Oral   SpO2: 100% 98% 99% 100%  Weight:      Height:       Constitutional: Resting bed, NAD, calm, comfortable Eyes: EOMI, lids and conjunctivae normal ENMT: Mucous membranes are moist. Posterior pharynx clear of any exudate or lesions.Normal dentition.  Neck: normal, supple, no masses. Respiratory: clear to auscultation bilaterally, no wheezing, no crackles. Normal respiratory effort. No accessory muscle use.  Cardiovascular: Regular rate and rhythm, no murmurs / rubs / gallops. No extremity edema. 2+ pedal pulses. Abdomen: no tenderness, no masses palpated. GU: Left hydrocele, no tenderness Musculoskeletal: no clubbing / cyanosis.  Swelling over left quadriceps, no bruising, ROM intact. Skin: no rashes, lesions, ulcers. No induration Neurologic: Sensation intact. Strength 5/5 in  all 4.  Psychiatric: Normal judgment and insight. Alert and oriented x 3. Normal mood.   EKG: Not performed.  Assessment/Plan Principal Problem:   Swelling of joint of pelvic region or thigh, left Active Problems:   Hyperlipidemia   HTN (hypertension)   CAD (coronary artery disease)   Myasthenia gravis (HCC)   Left hydrocele   Brandon Roy is a 89 y.o. male with medical history significant for CAD s/p PCI, HTN, HLD, CAS s/p left CEA 2019, myasthenia gravis on Mestinon who is admitted for evaluation of nonspecific left quadriceps swelling and complex collection adjacent to left greater trochanter.  Assessment and Plan: Subcutaneous swelling of left quadriceps Complex collection adjacent to left greater trochanter: Seen on CT imaging, collection is measuring 8.3 x 5.2 x 3.5 cm.  Differential includes bursitis versus infection or resolving hematoma.  Leukocytosis noted, otherwise afebrile and hemodynamically stable.  He already received vancomycin and ceftriaxone in the ED. Patient denies any injury to the area. - Orthopedics to consult - Hold further antibiotics - Follow MRI left femur - Follow ESR, CRP; check CK level  Large left hydrocele: CT notes large left hydrocele with 3 cm nodular component.  This was previously evaluated in July 2014 by ultrasound, measuring 3 mm and felt to be small benign neoplasm.  Patient denies pain to the area. - US  scrotum  CAD s/p remote PCI Carotid artery stenosis s/p left CEA 2019: Holding aspirin , Plavix  for now.  Holding Zetia  and statin as well pending CK.  Continue Lopressor .  Hypertension: Continue Lopressor .  Hyperlipidemia: Holding statin and Zetia .  Myasthenia gravis: Continue Mestinon.  Patient states he has been off prednisone  for about 1.5 weeks.  No sign of adrenal insufficiency.   DVT prophylaxis: SCDs Start: 05/10/24 2121 Code Status: Full code, confirmed with patient on admission Family Communication: Discussed with  patient, he has discussed with family Disposition Plan: From home, dispo pending clinical progress Consults called: Orthopedics Severity of Illness: The appropriate patient status for this patient is OBSERVATION. Observation status is judged to be reasonable and necessary in order to provide the required intensity of service to ensure the patient's safety. The patient's presenting symptoms, physical exam findings, and initial radiographic and laboratory data in the context of their medical condition is felt to place them at decreased risk for further clinical deterioration. Furthermore, it is anticipated that the patient will be medically stable for discharge  from the hospital within 2 midnights of admission.   Jorie Blanch MD [facility]  If 7PM-7AM, please contact night-coverage [email or web address]  05/10/2024, 9:34 PM

## 2024-05-11 DIAGNOSIS — I10 Essential (primary) hypertension: Secondary | ICD-10-CM | POA: Diagnosis not present

## 2024-05-11 DIAGNOSIS — I2510 Atherosclerotic heart disease of native coronary artery without angina pectoris: Secondary | ICD-10-CM

## 2024-05-11 DIAGNOSIS — M25452 Effusion, left hip: Secondary | ICD-10-CM | POA: Diagnosis not present

## 2024-05-11 DIAGNOSIS — G7000 Myasthenia gravis without (acute) exacerbation: Secondary | ICD-10-CM | POA: Diagnosis not present

## 2024-05-11 DIAGNOSIS — M7989 Other specified soft tissue disorders: Principal | ICD-10-CM

## 2024-05-11 DIAGNOSIS — N433 Hydrocele, unspecified: Secondary | ICD-10-CM

## 2024-05-11 LAB — BASIC METABOLIC PANEL WITH GFR
Anion gap: 9 (ref 5–15)
BUN: 24 mg/dL — ABNORMAL HIGH (ref 8–23)
CO2: 27 mmol/L (ref 22–32)
Calcium: 9.3 mg/dL (ref 8.9–10.3)
Chloride: 107 mmol/L (ref 98–111)
Creatinine, Ser: 0.58 mg/dL — ABNORMAL LOW (ref 0.61–1.24)
GFR, Estimated: >60 mL/min (ref >60)
Glucose, Bld: 101 mg/dL — ABNORMAL HIGH (ref 70–99)
Potassium: 3.9 mmol/L (ref 3.5–5.1)
Sodium: 143 mmol/L (ref 135–145)

## 2024-05-11 LAB — CBC
HCT: 33.7 % — ABNORMAL LOW (ref 39.0–52.0)
Hemoglobin: 10.9 g/dL — ABNORMAL LOW (ref 13.0–17.0)
MCH: 31.1 pg (ref 26.0–34.0)
MCHC: 32.3 g/dL (ref 30.0–36.0)
MCV: 96.3 fL (ref 80.0–100.0)
Platelets: 250 K/uL (ref 150–400)
RBC: 3.5 MIL/uL — ABNORMAL LOW (ref 4.22–5.81)
RDW: 13.4 % (ref 11.5–15.5)
WBC: 10.6 K/uL — ABNORMAL HIGH (ref 4.0–10.5)
nRBC: 0 % (ref 0.0–0.2)

## 2024-05-11 MED ORDER — LIDOCAINE 5 % EX PTCH: 1.0000 | MEDICATED_PATCH | CUTANEOUS | 0 refills | Status: AC

## 2024-05-11 MED ORDER — ENSURE PLUS HIGH PROTEIN PO LIQD: 237.0000 mL | Freq: Two times a day (BID) | ORAL | 2 refills | Status: AC

## 2024-05-11 NOTE — Plan of Care (Signed)

## 2024-05-11 NOTE — Discharge Summary (Signed)
 Physician Discharge Summary   Patient: Brandon Roy MRN: [id number] DOB: 1933-11-20  Admit date:     05/10/2024  Discharge date: 05/11/24  Discharge Physician: Amaryllis Dare   PCP: Onita Rush, MD   Recommendations at discharge:  Please follow-up with orthopedic surgery Follow-up with outpatient urology Follow-up with primary care provider  Discharge Diagnoses: Principal Problem:   Swelling of joint of pelvic region or thigh, left Active Problems:   Hyperlipidemia   HTN (hypertension)   CAD (coronary artery disease)   Myasthenia gravis (HCC)   Left hydrocele   Left leg swelling   Hospital Course: Brandon Roy is a 89 y.o. male with medical history significant for CAD s/p PCI, HTN, HLD, CAS s/p left CEA 2019, myasthenia gravis on Mestinon who is admitted for evaluation of nonspecific left quadriceps swelling and complex collection adjacent to left greater trochanter.  Apparently his symptoms started after mowing grass.  Patient was on chronic steroid for a long time and recently tapered off about 1.5 weeks ago.  On presentation hemodynamically stable, labs with some leukocytosis at 13.7, rest of the labs stable.  CT venogram abdomen/pelvis/lower extremities IMPRESSION: 1. Suboptimal opacification of the bilateral femoral and popliteal veins bilaterally. The possibility of underlying deep venous thrombosis cannot be excluded. 2. Diffuse subcutaneous swelling with asymmetric enlargement of the quadriceps muscle on the left, which may be infectious or inflammatory. Complex collection adjacent to the greater trochanter, possible bursitis versus infection or resolving hematoma. Consider MRI for further evaluation. 3. Large left hydrocele containing a 3 cm nodular component. Ultrasound is recommended for further evaluation. 4. Enlarged prostate gland. 5. Hepatic steatosis. 6. Diverticulosis without diverticulitis. 7. Coronary artery calcifications and aortic  atherosclerosis.  Lower extremity venous Doppler was negative for DVT.  Ultrasound scrotum ordered.  Orthopedic surgery was consulted.  10/12: Vital stable, improving leukocytosis now at 10.6, CRP mildly elevated at 1.4, CK normal. Scrotal ultrasound with no testicular abnormality, large complex left hydrocele which need to be followed up with urology as outpatient.  Orthopedic surgery evaluated him, likely overuse injury versus severe trochanteric bursitis and they were recommending PT and OT evaluation and outpatient follow-up.  Orthopedic surgery will decide about further imaging if needed as outpatient.  Patient will continue with his home medications and need to have a close follow-up with his providers for further assistance.   Consultants: Orthopedic surgery Procedures performed: None Disposition: Home Diet recommendation:  Discharge Diet Orders (From admission, onward)     Start     Ordered   05/11/24 0000  Diet - low sodium heart healthy        05/11/24 1431           Cardiac diet DISCHARGE MEDICATION: Allergies as of 05/11/2024       Reactions   Cinnamon Itching   Lipitor [atorvastatin Calcium ] Other (See Comments)   Reaction not recalled- just didn't work well with me   Shellfish Allergy Other (See Comments)   Tape Other (See Comments)   Valsartan -hydrochlorothiazide  Itching   The generic version (Valsartan -HCTZ) caused patient to itch   Zocor [simvastatin] Other (See Comments)   Reaction not recalled- just didn't work well with me        Medication List     TAKE these medications    aspirin  81 MG tablet Take 1 tablet (81 mg total) by mouth daily.   cholecalciferol 25 MCG (1000 UNIT) tablet Commonly known as: VITAMIN D3 Take 1,000 Units by mouth daily.   clopidogrel  75  MG tablet Commonly known as: PLAVIX  TAKE 1 TABLET (75 MG TOTAL) BY MOUTH DAILY.   cyanocobalamin  1000 MCG tablet Commonly known as: VITAMIN B12 Take 1,000 mcg by mouth  at bedtime.   ezetimibe  10 MG tablet Commonly known as: ZETIA  Take 1 tablet (10 mg total) by mouth daily. Please schedule an overdue appointment for future refills   feeding supplement Liqd Take 237 mLs by mouth 2 (two) times daily between meals. Start taking on: May 12, 2024   fenofibrate  160 MG tablet TAKE 1 TABLET (160 MG TOTAL) BY MOUTH DAILY.   lidocaine  5 % Commonly known as: Lidoderm  Place 1 patch onto the skin daily for 10 days. Remove & Discard patch within 12 hours or as directed by MD   metoprolol  tartrate 50 MG tablet Commonly known as: LOPRESSOR  Take 25 mg by mouth 2 (two) times daily.   predniSONE  5 MG tablet Commonly known as: DELTASONE  Take 1 tablet (5 mg total) by mouth daily with breakfast.   rosuvastatin  20 MG tablet Commonly known as: CRESTOR  Take 20 mg by mouth daily.   vitamin C 1000 MG tablet Take 1,000 mg by mouth daily.   zinc gluconate 50 MG tablet Take 50 mg by mouth daily.        Follow-up Information     Onita Rush, MD. Schedule an appointment as soon as possible for a visit in 1 week(s).   Specialty: Internal Medicine Contact information: [address] [place] [address] [phone]         Jerri Kay HERO, MD. Schedule an appointment as soon as possible for a visit in 3 day(s).   Specialty: Orthopedic Surgery Contact information: [address]  [place] [address] [phone]                Discharge Exam: Fredricka Weights   05/10/24 1325 05/10/24 2230  Weight: 59 kg 62.4 kg   General.  Frail elderly man, in no acute distress. Pulmonary.  Lungs clear bilaterally, normal respiratory effort. CV.  Regular rate and rhythm, no JVD, rub or murmur. Abdomen.  Soft, nontender, nondistended, BS positive. CNS.  Alert and oriented .  No focal neurologic deficit. Extremities.  No edema, no cyanosis, pulses intact and symmetrical. Psychiatry.  Judgment and insight appears normal.   Condition at discharge:  stable  The results of significant diagnostics from this hospitalization (including imaging, microbiology, ancillary and laboratory) are listed below for reference.   Imaging Studies: US  SCROTUM Result Date: 05/10/2024 CLINICAL DATA:  Nodule of testis EXAM: ULTRASOUND OF SCROTUM TECHNIQUE: Complete ultrasound examination of the testicles, epididymis, and other scrotal structures was performed. COMPARISON:  07/01/2013 FINDINGS: Right testicle Measurements: 4.2 x 1.6 x 2.9 cm. No mass or microlithiasis visualized. Left testicle Measurements: 4.0 x 2.8 x 3.2 cm. No mass or microlithiasis visualized. Right epididymis:  Multiple tiny cysts. Left epididymis:  Not visualized. Hydrocele:  Large complex left hydrocele. Varicocele:  None visualized. IMPRESSION: No testicular abnormality. Large complex left hydrocele. Electronically Signed   By: Franky Crease M.D.   On: 05/10/2024 22:18   VAS US  LOWER EXTREMITY VENOUS (DVT) (7a-7p) Result Date: 05/10/2024  Lower Venous DVT Study Patient Name:  GAL FELDHAUS  Date of Exam:   05/10/2024 Medical Rec #: [id number]     Accession #:    [id number] Date of Birth: 28-May-1934     Patient Gender: M Patient Age:   15 years Exam Location:  [facility] Procedure:      VAS US   LOWER EXTREMITY VENOUS (DVT) Referring Phys: ILEANA ECK --------------------------------------------------------------------------------  Indications: Swelling.  Risk Factors: None identified. Comparison Study: No prior studies. Performing Technologist: Cordella Collet RVT  Examination Guidelines: A complete evaluation includes B-mode imaging, spectral Doppler, color Doppler, and power Doppler as needed of all accessible portions of each vessel. Bilateral testing is considered an integral part of a complete examination. Limited examinations for reoccurring indications may be performed as noted. The reflux portion of the exam is performed with the patient in reverse Trendelenburg.   +-----+---------------+---------+-----------+----------+--------------+ RIGHTCompressibilityPhasicitySpontaneityPropertiesThrombus Aging +-----+---------------+---------+-----------+----------+--------------+ CFV  Full           Yes      Yes                                 +-----+---------------+---------+-----------+----------+--------------+   +---------+---------------+---------+-----------+----------+--------------+ LEFT     CompressibilityPhasicitySpontaneityPropertiesThrombus Aging +---------+---------------+---------+-----------+----------+--------------+ CFV      Full           Yes      Yes                                 +---------+---------------+---------+-----------+----------+--------------+ SFJ      Full                                                        +---------+---------------+---------+-----------+----------+--------------+ FV Prox  Full                                                        +---------+---------------+---------+-----------+----------+--------------+ FV Mid   Full                                                        +---------+---------------+---------+-----------+----------+--------------+ FV DistalFull                                                        +---------+---------------+---------+-----------+----------+--------------+ PFV      Full                                                        +---------+---------------+---------+-----------+----------+--------------+ POP      Full           Yes      Yes                                 +---------+---------------+---------+-----------+----------+--------------+ PTV      Full                                                        +---------+---------------+---------+-----------+----------+--------------+  PERO     Full                                                         +---------+---------------+---------+-----------+----------+--------------+     Summary: RIGHT: - No evidence of common femoral vein obstruction.   LEFT: - There is no evidence of deep vein thrombosis in the lower extremity.  - No cystic structure found in the popliteal fossa.  *See table(s) above for measurements and observations. Electronically signed by Gaile New MD on 05/10/2024 at 10:11:39 PM.    Final    CT VENOGRAM ABD/PELVIS/LOWER EXT BILAT Result Date: 05/10/2024 CLINICAL DATA:  Right thigh swelling. EXAM: CT VENOGRAM ABDOMEN AND PELVIS AND LOWER EXTREMITY BILATERAL TECHNIQUE: Venographic phase images of the abdomen, pelvis and lower extremities were obtained following the administration of intravenous contrast. Multiplanar reformats and maximum intensity projections were generated. RADIATION DOSE REDUCTION: This exam was performed according to the departmental dose-optimization program which includes automated exposure control, adjustment of the mA and/or kV according to patient size and/or use of iterative reconstruction technique. CONTRAST:  OMNIPAQUE  IOHEXOL  350 MG/ML SOLN COMPARISON:  10/10/2022. FINDINGS: Lower chest: No acute abnormality. Multi-vessel coronary artery calcifications are noted. Hepatobiliary: No focal abnormality in the liver. Fatty infiltration of the liver is noted. A stone is seen in the gallbladder. No biliary ductal dilatation. Pancreas: Unremarkable. No pancreatic ductal dilatation or surrounding inflammatory changes. Spleen: Normal in size without focal abnormality. Adrenals/Urinary Tract: The adrenal glands are within normal limits. The kidneys enhance symmetrically. No renal calculus or obstructive uropathy bilaterally. The bladder is unremarkable. Stomach/Bowel: The stomach is within normal limits. No bowel obstruction, free air, or pneumatosis is seen. Appendix appears normal. Scattered diverticula are noted along the colon without evidence of diverticulitis. A  moderate amount of retained stool is noted in the colon. Vascular/Lymphatic: Aortic atherosclerosis. No enlarged abdominal or pelvic lymph nodes. Reproductive: Prostate gland is enlarged. Other: No abdominopelvic ascites. There is a large left hydrocele containing a 3 cm nodular density. Musculoskeletal: Degenerative changes are present in the thoracolumbar spine. No acute osseous abnormality. IVC: No evidence for thrombus or stenosis. Portal and mesenteric veins: No evidence for thrombus or stenosis. Bilateral iliac veins: No evidence for thrombus or stenosis. Right lower extremity: The femoral and popliteal veins opacify heterogeneously, possibly related to mixing artifact. The possibility of underlying thrombosis cannot be completely excluded. Left lower extremity: The femoral and popliteal veins opacify heterogeneously and the possibility of underlying DVT cannot be completely excluded. There is diffuse subcutaneous edema and asymmetric enlargement of the quadriceps muscles involving in the left lower extremity. Intramuscular edema and fat stranding is also noted. There is a complex collection over the greater trochanter on the left with possible septation measuring 8.3 x 5.2 x 3.5 cm. IMPRESSION: 1. Suboptimal opacification of the bilateral femoral and popliteal veins bilaterally. The possibility of underlying deep venous thrombosis cannot be excluded. 2. Diffuse subcutaneous swelling with asymmetric enlargement of the quadriceps muscle on the left, which may be infectious or inflammatory. Complex collection adjacent to the greater trochanter, possible bursitis versus infection or resolving hematoma. Consider MRI for further evaluation. 3. Large left hydrocele containing a 3 cm nodular component. Ultrasound is recommended for further evaluation. 4. Enlarged prostate gland. 5. Hepatic steatosis. 6. Diverticulosis without diverticulitis. 7. Coronary artery calcifications and  aortic atherosclerosis. Electronically  Signed   By: Leita Birmingham M.D.   On: 05/10/2024 19:06    Microbiology: Results for orders placed or performed during the hospital encounter of 05/16/18  Surgical pcr screen     Status: None   Collection Time: 05/16/18 11:50 AM   Specimen: Nasal Mucosa; Nasal Swab  Result Value Ref Range Status   MRSA, PCR NEGATIVE NEGATIVE Final   Staphylococcus aureus NEGATIVE NEGATIVE Final    Comment: (NOTE) The Xpert SA Assay (FDA approved for NASAL specimens in patients 14 years of age and older), is one component of a comprehensive surveillance program. It is not intended to diagnose infection nor to guide or monitor treatment. Performed at [facility], [address] [place], [place] [address]     Labs: CBC: Recent Labs  Lab 05/10/24 1532 05/11/24 0321  WBC 13.7* 10.6*  NEUTROABS 11.7*  --   HGB 12.1* 10.9*  HCT 37.3* 33.7*  MCV 94.4 96.3  PLT 282 250   Basic Metabolic Panel: Recent Labs  Lab 05/10/24 1532 05/11/24 0321  NA 141 143  K 4.0 3.9  CL 105 107  CO2 23 27  GLUCOSE 112* 101*  BUN 24* 24*  CREATININE 0.68 0.58*  CALCIUM  9.3 9.3   Liver Function Tests: No results for input(s): AST, ALT, ALKPHOS, BILITOT, PROT, ALBUMIN  in the last 168 hours. CBG: No results for input(s): GLUCAP in the last 168 hours.  Discharge time spent: greater than 30 minutes.  This record has been created using Conservation officer, historic buildings. Errors have been sought and corrected,but may not always be located. Such creation errors do not reflect on the standard of care.   Signed: Amaryllis Dare, MD [facility] 05/11/2024

## 2024-05-11 NOTE — Progress Notes (Signed)
 Patient sleeping on couch in room instead of bed, refuses any alarms, has been ambulating independently in the room since admission, gait steady, states his leg feel better than when he came in, only slight tightness, has been NPO since midnight, also still in street clothes, refused hospital gown but did allow skin check on admission.

## 2024-05-11 NOTE — Consult Note (Signed)
 ORTHOPAEDIC CONSULTATION  REQUESTING PHYSICIAN: Caleen Qualia, MD  Chief Complaint: Left thigh swelling  HPI: Brandon Roy is a 89 y.o. male who presents with left thigh swelling for 2 days after mowing the yard with push mower.  Denies any injuries.  Had been on chronic prednisone  until the last couple of weeks.  Denies any fevers, chills, n/v.  Denies any pain in the hip, just swelling of the thigh.    Past Medical History:  Diagnosis Date   CAD (coronary artery disease)    Status post recent PTCA and stenting of the proximal LAD (3.84mmx23mm Promus stent, post dilated using a 3.75 [place] sprinter) and the distal LAD   Carotid artery occlusion    HTN (hypertension)    Hyperlipidemia    Peripheral arterial disease    Pneumonia 1941   Shingles    Wears dentures    Wears glasses    Past Surgical History:  Procedure Laterality Date   CARDIAC CATHETERIZATION  2000-2011   6 caths and 3 stents total (05/17/2018)   CAROTID ENDARTERECTOMY Left 05/17/2018   CATARACT EXTRACTION W/ INTRAOCULAR LENS  IMPLANT, BILATERAL Bilateral    COLONOSCOPY     CORONARY ANGIOPLASTY WITH STENT PLACEMENT  Sep 24, 1998   CORONARY ANGIOPLASTY WITH STENT PLACEMENT     Status post and stenting of his left circumflex artery    ENDARTERECTOMY Left 05/17/2018   Procedure: EXPLORATION OF NECK;  Surgeon: Serene Gaile ORN, MD;  Location: [facility] OR;  Service: Vascular;  Laterality: Left;   ENDARTERECTOMY Left 05/17/2018   Procedure: LEFT ENDARTERECTOMY CAROTID;  Surgeon: Oris Krystal FALCON, MD;  Location: [facility] OR;  Service: Vascular;  Laterality: Left;   EYE SURGERY     HEMATOMA EVACUATION Left 05/17/2018   S/P carotid endarterectomy   MULTIPLE TOOTH EXTRACTIONS     PATCH ANGIOPLASTY Left 05/17/2018   Procedure: PATCH ANGIOPLASTY USING HEMASHIELD PLATINUM FINESSE;  Surgeon: Oris Krystal FALCON, MD;  Location: [facility] OR;  Service: Vascular;  Laterality: Left;   TONSILLECTOMY     Social History   Socioeconomic History   Marital  status: Married    Spouse name: Not on file   Number of children: 2   Years of education: 12   Highest education level: Not on file  Occupational History   Not on file  Tobacco Use   Smoking status: Former    Types: Cigarettes   Smokeless tobacco: Never   Tobacco comments:     Back in the 50's- 60's  Vaping Use   Vaping status: Never Used  Substance and Sexual Activity   Alcohol  use: Yes    Comment: 05/17/2018 might have up to 5 drinks/year   Drug use: Never   Sexual activity: Not on file  Other Topics Concern   Not on file  Social History Narrative   Right handed   Drinks caffeine   One floor home   Lives with wife   retired   Social Drivers of Corporate investment banker Strain: Not on file  Food Insecurity: No Food Insecurity (05/10/2024)   Hunger Vital Sign    Worried About Radiation protection practitioner of Food in the Last Year: Never true    Ran Out of Food in the Last Year: Never true  Transportation Needs: No Transportation Needs (05/10/2024)   PRAPARE - Administrator, Civil Service (Medical): No    Lack of Transportation (Non-Medical): No  Physical Activity: Not on file  Stress: Not on file  Social Connections: Unknown (05/10/2024)   Social Connection and Isolation Panel    Frequency of Communication with Friends and Family: Patient declined    Frequency of Social Gatherings with Friends and Family: Once a week    Attends Religious Services: 1 to 4 times per year    Active Member of [employer] or Organizations: Yes    Attends Banker Meetings: 1 to 4 times per year    Marital Status: Married   Family History  Problem Relation Age of Onset   Heart attack Father    Heart disease Father    - negative except otherwise stated in the family history section Allergies  Allergen Reactions   Cinnamon Itching   Lipitor [Atorvastatin Calcium ] Other (See Comments)    Reaction not recalled- just didn't work well with me   Shellfish Allergy Other (See  Comments)   Tape Other (See Comments)   Valsartan -Hydrochlorothiazide  Itching    The generic version (Valsartan -HCTZ) caused patient to itch   Zocor [Simvastatin] Other (See Comments)    Reaction not recalled- just didn't work well with me   Prior to Admission medications   Medication Sig Start Date End Date Taking? Authorizing Provider  Ascorbic Acid (VITAMIN C) 1000 MG tablet Take 1,000 mg by mouth daily.   Yes [provider]  aspirin  81 MG tablet Take 1 tablet (81 mg total) by mouth daily. 01/05/14  Yes Nahser, Aleene PARAS, MD  cholecalciferol (VITAMIN D3) 25 MCG (1000 UNIT) tablet Take 1,000 Units by mouth daily.   Yes [provider]  clopidogrel  (PLAVIX ) 75 MG tablet TAKE 1 TABLET (75 MG TOTAL) BY MOUTH DAILY. 06/28/15  Yes Nahser, Aleene PARAS, MD  ezetimibe  (ZETIA ) 10 MG tablet Take 1 tablet (10 mg total) by mouth daily. Please schedule an overdue appointment for future refills 01/23/24  Yes Nahser, Aleene PARAS, MD  fenofibrate  160 MG tablet TAKE 1 TABLET (160 MG TOTAL) BY MOUTH DAILY. 05/31/15  Yes Nahser, Aleene PARAS, MD  metoprolol  tartrate (LOPRESSOR ) 50 MG tablet Take 25 mg by mouth 2 (two) times daily.   Yes [provider]  predniSONE  (DELTASONE ) 5 MG tablet Take 1 tablet (5 mg total) by mouth daily with breakfast. 05/09/23  Yes Patel, Donika K, DO  rosuvastatin  (CRESTOR ) 20 MG tablet Take 20 mg by mouth daily.   Yes [provider]  vitamin B-12 (CYANOCOBALAMIN ) 1000 MCG tablet Take 1,000 mcg by mouth at bedtime.    Yes [provider]  zinc gluconate 50 MG tablet Take 50 mg by mouth daily.   Yes [provider]   US  SCROTUM Result Date: 05/10/2024 CLINICAL DATA:  Nodule of testis EXAM: ULTRASOUND OF SCROTUM TECHNIQUE: Complete ultrasound examination of the testicles, epididymis, and other scrotal structures was performed. COMPARISON:  07/01/2013 FINDINGS: Right testicle Measurements: 4.2 x 1.6 x 2.9 cm. No mass or microlithiasis  visualized. Left testicle Measurements: 4.0 x 2.8 x 3.2 cm. No mass or microlithiasis visualized. Right epididymis:  Multiple tiny cysts. Left epididymis:  Not visualized. Hydrocele:  Large complex left hydrocele. Varicocele:  None visualized. IMPRESSION: No testicular abnormality. Large complex left hydrocele. Electronically Signed   By: Franky Crease M.D.   On: 05/10/2024 22:18   VAS US  LOWER EXTREMITY VENOUS (DVT) (7a-7p) Result Date: 05/10/2024  Lower Venous DVT Study Patient Name:  Brandon Roy  Date of Exam:   05/10/2024 Medical Rec #: [id number]     Accession #:    [id number] Date of Birth: 10-Jan-1934  Patient Gender: M Patient Age:   58 years Exam [facility]  [facility] Procedure:      VAS US  LOWER EXTREMITY VENOUS (DVT) Referring Phys: ILEANA ECK --------------------------------------------------------------------------------  Indications: Swelling.  Risk Factors: None identified. Comparison Study: No prior studies. Performing Technologist: Cordella Collet RVT  Examination Guidelines: A complete evaluation includes B-mode imaging, spectral Doppler, color Doppler, and power Doppler as needed of all accessible portions of each vessel. Bilateral testing is considered an integral part of a complete examination. Limited examinations for reoccurring indications may be performed as noted. The reflux portion of the exam is performed with the patient in reverse Trendelenburg.  +-----+---------------+---------+-----------+----------+--------------+ RIGHTCompressibilityPhasicitySpontaneityPropertiesThrombus Aging +-----+---------------+---------+-----------+----------+--------------+ CFV  Full           Yes      Yes                                 +-----+---------------+---------+-----------+----------+--------------+   +---------+---------------+---------+-----------+----------+--------------+ LEFT     CompressibilityPhasicitySpontaneityPropertiesThrombus Aging  +---------+---------------+---------+-----------+----------+--------------+ CFV      Full           Yes      Yes                                 +---------+---------------+---------+-----------+----------+--------------+ SFJ      Full                                                        +---------+---------------+---------+-----------+----------+--------------+ FV Prox  Full                                                        +---------+---------------+---------+-----------+----------+--------------+ FV Mid   Full                                                        +---------+---------------+---------+-----------+----------+--------------+ FV DistalFull                                                        +---------+---------------+---------+-----------+----------+--------------+ PFV      Full                                                        +---------+---------------+---------+-----------+----------+--------------+ POP      Full           Yes      Yes                                 +---------+---------------+---------+-----------+----------+--------------+ PTV  Full                                                        +---------+---------------+---------+-----------+----------+--------------+ PERO     Full                                                        +---------+---------------+---------+-----------+----------+--------------+     Summary: RIGHT: - No evidence of common femoral vein obstruction.   LEFT: - There is no evidence of deep vein thrombosis in the lower extremity.  - No cystic structure found in the popliteal fossa.  *See table(s) above for measurements and observations. Electronically signed by Gaile New MD on 05/10/2024 at 10:11:39 PM.    Final    CT VENOGRAM ABD/PELVIS/LOWER EXT BILAT Result Date: 05/10/2024 CLINICAL DATA:  Right thigh swelling. EXAM: CT VENOGRAM ABDOMEN AND PELVIS AND LOWER EXTREMITY  BILATERAL TECHNIQUE: Venographic phase images of the abdomen, pelvis and lower extremities were obtained following the administration of intravenous contrast. Multiplanar reformats and maximum intensity projections were generated. RADIATION DOSE REDUCTION: This exam was performed according to the departmental dose-optimization program which includes automated exposure control, adjustment of the mA and/or kV according to patient size and/or use of iterative reconstruction technique. CONTRAST:  OMNIPAQUE  IOHEXOL  350 MG/ML SOLN COMPARISON:  10/10/2022. FINDINGS: Lower chest: No acute abnormality. Multi-vessel coronary artery calcifications are noted. Hepatobiliary: No focal abnormality in the liver. Fatty infiltration of the liver is noted. A stone is seen in the gallbladder. No biliary ductal dilatation. Pancreas: Unremarkable. No pancreatic ductal dilatation or surrounding inflammatory changes. Spleen: Normal in size without focal abnormality. Adrenals/Urinary Tract: The adrenal glands are within normal limits. The kidneys enhance symmetrically. No renal calculus or obstructive uropathy bilaterally. The bladder is unremarkable. Stomach/Bowel: The stomach is within normal limits. No bowel obstruction, free air, or pneumatosis is seen. Appendix appears normal. Scattered diverticula are noted along the colon without evidence of diverticulitis. A moderate amount of retained stool is noted in the colon. Vascular/Lymphatic: Aortic atherosclerosis. No enlarged abdominal or pelvic lymph nodes. Reproductive: Prostate gland is enlarged. Other: No abdominopelvic ascites. There is a large left hydrocele containing a 3 cm nodular density. Musculoskeletal: Degenerative changes are present in the thoracolumbar spine. No acute osseous abnormality. IVC: No evidence for thrombus or stenosis. Portal and mesenteric veins: No evidence for thrombus or stenosis. Bilateral iliac veins: No evidence for thrombus or stenosis. Right lower  extremity: The femoral and popliteal veins opacify heterogeneously, possibly related to mixing artifact. The possibility of underlying thrombosis cannot be completely excluded. Left lower extremity: The femoral and popliteal veins opacify heterogeneously and the possibility of underlying DVT cannot be completely excluded. There is diffuse subcutaneous edema and asymmetric enlargement of the quadriceps muscles involving in the left lower extremity. Intramuscular edema and fat stranding is also noted. There is a complex collection over the greater trochanter on the left with possible septation measuring 8.3 x 5.2 x 3.5 cm. IMPRESSION: 1. Suboptimal opacification of the bilateral femoral and popliteal veins bilaterally. The possibility of underlying deep venous thrombosis cannot be excluded. 2. Diffuse subcutaneous swelling with asymmetric enlargement of the quadriceps muscle on the left,  which may be infectious or inflammatory. Complex collection adjacent to the greater trochanter, possible bursitis versus infection or resolving hematoma. Consider MRI for further evaluation. 3. Large left hydrocele containing a 3 cm nodular component. Ultrasound is recommended for further evaluation. 4. Enlarged prostate gland. 5. Hepatic steatosis. 6. Diverticulosis without diverticulitis. 7. Coronary artery calcifications and aortic atherosclerosis. Electronically Signed   By: Leita Birmingham M.D.   On: 05/10/2024 19:06   - pertinent xrays, CT, MRI studies were reviewed and independently interpreted  Positive ROS: All other systems have been reviewed and were otherwise negative with the exception of those mentioned in the HPI and as above.  Physical Exam: General: No acute distress Cardiovascular: No pedal edema Respiratory: No cyanosis, no use of accessory musculature GI: No organomegaly, abdomen is soft and non-tender Skin: No lesions in the area of chief complaint Neurologic: Sensation intact distally Psychiatric:  Patient is at baseline mood and affect Lymphatic: No axillary or cervical lymphadenopathy  MUSCULOSKELETAL:  Left thigh is slightly swollen Small left knee joint effusion Fluid painless motion of the knee and hip Trochanteric region is nontender but is swollen No cellulitis   Assessment: Left hip/thigh swelling  Plan: Unsure of the process but favor overuse condition.  Given age, h/o chronic steroids and recent mowing, would think he has severe trochanteric bursitis or attritional abductor rupture.  Does not appear to be an infectious process.  May have diet.  Mobilize with PT.  Follow up as outpatient and can determine additional imaging at that time based on initial symptomatic treatment.  Thank you for the consult and the opportunity to see Mr. Brandon Roy. Ozell Cummins, MD [facility] 7:43 AM

## 2024-05-11 NOTE — Evaluation (Signed)
 Physical Therapy Evaluation Patient Details Name: RYOTT RAFFERTY MRN: [id number] DOB: [birth year] Today's Date: 05/11/2024  History of Present Illness  89 y.o. male who presents with left thigh swelling for 2 days after mowing the yard with push mower.  Denies any injuries. ortho consulted -Unsure of the process but favor overuse condition.  Given age, h/o chronic steroids and recent mowing, would think he has severe trochanteric bursitis or attritional abductor rupture.  Does not appear to be an infectious process  PMH:  CAD s/p PCI, HTN, HLD, CAS s/p left CEA 2019, myasthenia gravis  Clinical Impression  Patient evaluated by Physical Therapy with no further acute PT needs identified. All education has been completed and the patient has no further questions.  reviewed limiting activity to allow healing LLE overuse injury, no lifting heavy objects, avoid amb on uneven surfaces initially,  use of ice/heat  as needed to L thigh 20 minutes on and 30 minutes off. Advised to to amb with cane vs the single crutch to offload LLE as needed for pain control; son present for session Pt amb ~ 250' and up/down 3 stairs with no assistance.  Pt has all DME at home, his wife is a retired PT and pt is well versed  in DME. Pt is independent at baseline and does all iADLs in the home as his wife has demenita.  Pt is ready to d/c from PT standpoint, no further needs,plan is to f/u with ortho as OP  See below for any follow-up Physical Therapy or equipment needs. PT is signing off. Thank you for this referral.         If plan is discharge home, recommend the following:     Can travel by private vehicle        Equipment Recommendations None recommended by PT  Recommendations for Other Services       Functional Status Assessment Patient has not had a recent decline in their functional status     Precautions / Restrictions Precautions Precautions: Fall Restrictions Weight Bearing Restrictions Per Provider  Order: No      Mobility  Bed Mobility               General bed mobility comments: nt pt on window seat    Transfers Overall transfer level: Independent                      Ambulation/Gait Ambulation/Gait assistance: Modified independent (Device/Increase time), Independent Gait Distance (Feet): 250 Feet Assistive device: None, Crutches Gait Pattern/deviations: Step-through pattern, Decreased step length - left       General Gait Details: 170' no device, no LOB, significantly decr stance time on L with decr step length on R; with incr distance deviations much improved with near equal wt shift to bil LEs. amb ~ 31'  with crutch, 15' rail touch in hallway to simulate cane.  Stairs Stairs: Yes Stairs assistance: Modified independent (Device/Increase time) Stair Management: One rail Left, Alternating pattern, Forwards Number of Stairs: 3 General stair comments: no assist, no LOB  Wheelchair Mobility     Tilt Bed    Modified Rankin (Stroke Patients Only)       Balance Overall balance assessment: No apparent balance deficits (not formally assessed)  Pertinent Vitals/Pain Pain Assessment Pain Assessment: Faces Faces Pain Scale: Hurts a little bit Pain Location: L thigh Pain Descriptors / Indicators: Sore Pain Intervention(s): Limited activity within patient's tolerance, Monitored during session    Home Living Family/patient expects to be discharged to:: Private residence Living Arrangements: Spouse/significant other Available Help at Discharge: Family;Available PRN/intermittently (son) Type of Home: House Home Access: Stairs to enter Entrance Stairs-Rails: Right Entrance Stairs-Number of Steps: 2   Home Layout: One level Home Equipment: Agricultural consultant (2 wheels);Cane - single point;Crutches;Wheelchair - manual Additional Comments: 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 AROM WFL; strength grossly WFL       Communication   Communication Communication: No apparent difficulties    Cognition Arousal: Alert Behavior During Therapy: WFL for tasks assessed/performed   PT - Cognitive impairments: No apparent impairments                         Following commands: Intact       Cueing Cueing Techniques: Verbal cues     General Comments General comments (skin integrity, edema, etc.): reviewed limiting activity, no lifting heavy objects, use of ice/heat  as needed to L thigh 20 minutes on and 30 minutes off.    Exercises     Assessment/Plan    PT Assessment Patient does not need any further PT services  PT Problem List         PT Treatment Interventions      PT Goals (Current goals can be found in the Care Plan section)  Acute Rehab PT Goals PT Goal Formulation: All assessment and education complete, DC therapy    Frequency       Co-evaluation               AM-PAC PT 6 Clicks Mobility  Outcome Measure Help needed turning from your back to your side while in a flat bed without using bedrails?: None Help needed moving from lying on your back to sitting on the side of a flat bed without using bedrails?: None Help needed moving to and from a bed to a chair (including a wheelchair)?: None Help needed standing up from a chair using your arms (e.g., wheelchair or bedside chair)?: None Help needed to walk in hospital room?: None Help needed climbing 3-5 steps with a railing? : None 6  Click Score: 24    End of Session Equipment Utilized During Treatment: Gait belt Activity Tolerance: Patient tolerated treatment well Patient left: with call bell/phone within reach;with family/visitor present Nurse Communication: Mobility status PT Visit Diagnosis: Other abnormalities of gait and mobility (R26.89)    Time: 8594-8576 PT Time Calculation (min) (ACUTE ONLY): 18 min   Charges:   PT Evaluation $PT Eval Low Complexity: 1 Low   PT General Charges $$ ACUTE PT VISIT: 1 Visit         Makinna Andy, PT  Acute Rehab Dept ([facility]) [phone]  05/11/2024   [facility] 05/11/2024, 2:35 PM

## 2024-05-11 NOTE — Progress Notes (Signed)
 Per Dr Jorie Blanch- MRI ok to be done in morning even though ordered stat.

## 2024-05-14 ENCOUNTER — Ambulatory Visit (INDEPENDENT_AMBULATORY_CARE_PROVIDER_SITE_OTHER): Admitting: Orthopaedic Surgery

## 2024-05-14 ENCOUNTER — Other Ambulatory Visit: Payer: Self-pay

## 2024-05-14 DIAGNOSIS — M79652 Pain in left thigh: Secondary | ICD-10-CM

## 2024-05-14 NOTE — Progress Notes (Signed)
 Office Visit Note   Patient: Brandon Roy           Date of Birth: Oct 22, 1933           MRN: [id number] Visit Date: 05/14/2024              Requested by: Onita Rush, MD [address] [place],  [place] [address] PCP: Onita Rush, MD   Assessment & Plan: Visit Diagnoses:  1. Left thigh pain     Plan: History of Present Illness Brandon Roy is an 89 year old male who follows up from the ED from this weekend.  Patient was admitted for nonspecific left thigh swelling.  Briefly, he developed left hip and thigh swelling couple days after mowing his yard.  He reported no pain or signs or symptoms of infection.  A CT scan showed nonspecific fluid collection around the trochanteric region.  IV antibiotics were administered by the ED as a result of the CT scan.  He was discharged from the hospital after my evaluation the next morning.  Since then he has noticed bruising around the thigh and knee area.  He is on aspirin  and Plavix .  He continues to report no pain or constitutional symptoms.  Physical Exam MUSCULOSKELETAL: Left thigh shows diffuse swelling and ecchymosis that is tender to touch.  There is no cellulitis.  He has a slight antalgic gait.  Weakness with hip abduction.  No pain in the groin with hip range of motion.  No sciatic tension signs.  No knee joint effusion.  Painless range of motion of the knee.  Vital signs are normal.  Assessment and Plan Left hip and thigh swelling and bruising.  Patient likely suffered an attritional rupture of the abductors from recent mowing.  Swelling and bruising consistent with him being on aspirin  and Plavix .  Nothing suggest an infectious process.  He is not reporting any pain.  He is using a cane to help with ambulation due to weak hip abduction.  Repeat white blood cell count was decreased.  Patient does take chronic steroids therefore could account for elevated white blood cell count. - Recommend continued symptomatic treatment with Ace  bandage compression - Patient politely declined outpatient physical therapy for strengthening. - Instructed that the swelling and bruising will improve naturally. - Follow-up if symptoms worsen.  Follow-Up Instructions: No follow-ups on file.   Orders:  Orders Placed This Encounter  Procedures   XR FEMUR MIN 2 VIEWS LEFT   No orders of the defined types were placed in this encounter.     Procedures: No procedures performed   Clinical Data: No additional findings.   Subjective: Chief Complaint  Patient presents with   Left Hip - Pain    HPI  Review of Systems  Constitutional: Negative.   HENT: Negative.    Eyes: Negative.   Respiratory: Negative.    Cardiovascular: Negative.   Gastrointestinal: Negative.   Endocrine: Negative.   Genitourinary: Negative.   Skin: Negative.   Allergic/Immunologic: Negative.   Neurological: Negative.   Hematological: Negative.   Psychiatric/Behavioral: Negative.    All other systems reviewed and are negative.    Objective: Vital Signs: There were no vitals taken for this visit.  Physical Exam Vitals and nursing note reviewed.  Constitutional:      Appearance: He is well-developed.  HENT:     Head: Normocephalic and atraumatic.  Eyes:     Pupils: Pupils are equal, round, and reactive to light.  Pulmonary:  Effort: Pulmonary effort is normal.  Abdominal:     Palpations: Abdomen is soft.  Musculoskeletal:        General: Normal range of motion.     Cervical back: Neck supple.  Skin:    General: Skin is warm.  Neurological:     Mental Status: He is alert and oriented to person, place, and time.  Psychiatric:        Behavior: Behavior normal.        Thought Content: Thought content normal.        Judgment: Judgment normal.     Ortho Exam  Specialty Comments:  No specialty comments available.  Imaging: XR FEMUR MIN 2 VIEWS LEFT Result Date: 05/14/2024 X-rays of the left femur show no acute abnormalities.   Mild degenerative changes within the hip joint and around the trochanteric region.    PMFS History: Patient Active Problem List   Diagnosis Date Noted   Left leg swelling 05/11/2024   Swelling of joint of pelvic region or thigh, left 05/10/2024   Myasthenia gravis (HCC) 05/10/2024   Left hydrocele 05/10/2024   Carotid stenosis 05/17/2018   Hyperlipidemia 11/20/2008   HTN (hypertension) 11/20/2008   CAD (coronary artery disease) 11/20/2008   Osteoarthritis 11/20/2008   Past Medical History:  Diagnosis Date   CAD (coronary artery disease)    Status post recent PTCA and stenting of the proximal LAD (3.76mmx23mm Promus stent, post dilated using a 3.75 [place] sprinter) and the distal LAD   Carotid artery occlusion    HTN (hypertension)    Hyperlipidemia    Peripheral arterial disease    Pneumonia 1941   Shingles    Wears dentures    Wears glasses     Family History  Problem Relation Age of Onset   Heart attack Father    Heart disease Father     Past Surgical History:  Procedure Laterality Date   CARDIAC CATHETERIZATION  2000-2011   6 caths and 3 stents total (05/17/2018)   CAROTID ENDARTERECTOMY Left 05/17/2018   CATARACT EXTRACTION W/ INTRAOCULAR LENS  IMPLANT, BILATERAL Bilateral    COLONOSCOPY     CORONARY ANGIOPLASTY WITH STENT PLACEMENT  Sep 24, 1998   CORONARY ANGIOPLASTY WITH STENT PLACEMENT     Status post and stenting of his left circumflex artery    ENDARTERECTOMY Left 05/17/2018   Procedure: EXPLORATION OF NECK;  Surgeon: Serene Gaile ORN, MD;  Location: [facility] OR;  Service: Vascular;  Laterality: Left;   ENDARTERECTOMY Left 05/17/2018   Procedure: LEFT ENDARTERECTOMY CAROTID;  Surgeon: Oris Krystal FALCON, MD;  Location: [facility] OR;  Service: Vascular;  Laterality: Left;   EYE SURGERY     HEMATOMA EVACUATION Left 05/17/2018   S/P carotid endarterectomy   MULTIPLE TOOTH EXTRACTIONS     PATCH ANGIOPLASTY Left 05/17/2018   Procedure: PATCH ANGIOPLASTY USING HEMASHIELD PLATINUM  FINESSE;  Surgeon: Oris Krystal FALCON, MD;  Location: [facility] OR;  Service: Vascular;  Laterality: Left;   TONSILLECTOMY     Social History   Occupational History   Not on file  Tobacco Use   Smoking status: Former    Types: Cigarettes   Smokeless tobacco: Never   Tobacco comments:     Back in the 50's- 60's  Vaping Use   Vaping status: Never Used  Substance and Sexual Activity   Alcohol  use: Yes    Comment: 05/17/2018 might have up to 5 drinks/year   Drug use: Never   Sexual activity: Not on file

## 2024-06-02 ENCOUNTER — Encounter: Payer: Self-pay | Admitting: Radiology
